# Patient Record
Sex: Male | Born: 1975 | Race: Black or African American | Hispanic: No | Marital: Married | Smoking: Never smoker
Health system: Southern US, Community
[De-identification: ages and names within clinical notes are randomized; demographics above are authoritative.]

## PROBLEM LIST (undated history)

## (undated) DIAGNOSIS — K219 Gastro-esophageal reflux disease without esophagitis: Secondary | ICD-10-CM

## (undated) DIAGNOSIS — G4730 Sleep apnea, unspecified: Secondary | ICD-10-CM

## (undated) DIAGNOSIS — R7303 Prediabetes: Secondary | ICD-10-CM

## (undated) DIAGNOSIS — E119 Type 2 diabetes mellitus without complications: Secondary | ICD-10-CM

## (undated) DIAGNOSIS — I10 Essential (primary) hypertension: Secondary | ICD-10-CM

## (undated) HISTORY — PX: VASECTOMY: SHX75

## (undated) HISTORY — DX: Essential (primary) hypertension: I10

## (undated) HISTORY — DX: Prediabetes: R73.03

## (undated) HISTORY — DX: Type 2 diabetes mellitus without complications: E11.9

---

## 2006-04-04 ENCOUNTER — Ambulatory Visit: Payer: Self-pay | Admitting: Sports Medicine

## 2006-11-02 ENCOUNTER — Ambulatory Visit: Payer: Self-pay | Admitting: Family Medicine

## 2006-11-02 LAB — CONVERTED CEMR LAB
ALT: 28 units/L (ref 0–53)
AST: 22 units/L (ref 0–37)
Alkaline Phosphatase: 57 units/L (ref 39–117)
BUN: 7 mg/dL (ref 6–23)
Basophils Relative: 0 % (ref 0.0–1.0)
Bilirubin, Direct: 0.1 mg/dL (ref 0.0–0.3)
CO2: 29 meq/L (ref 19–32)
Calcium: 9 mg/dL (ref 8.4–10.5)
Chloride: 104 meq/L (ref 96–112)
Eosinophils Relative: 1.2 % (ref 0.0–5.0)
GFR calc Af Amer: 125 mL/min
GFR calc non Af Amer: 103 mL/min
Glucose, Bld: 107 mg/dL — ABNORMAL HIGH (ref 70–99)
HDL: 26.9 mg/dL — ABNORMAL LOW (ref >39.0)
Ketones, urine, test strip: NEGATIVE
LDL Cholesterol: 117 mg/dL — ABNORMAL HIGH (ref 0–99)
Monocytes Relative: 12.8 % — ABNORMAL HIGH (ref 3.0–11.0)
Neutro Abs: 1.9 10*3/uL (ref 1.4–7.7)
Nitrite: NEGATIVE
Platelets: 216 10*3/uL (ref 150–400)
Protein, U semiquant: NEGATIVE
RBC: 4.66 M/uL (ref 4.22–5.81)
Total CHOL/HDL Ratio: 6.4
Total Protein: 7.3 g/dL (ref 6.0–8.3)
Triglycerides: 145 mg/dL (ref 0–149)
Urobilinogen, UA: 0.2
VLDL: 29 mg/dL (ref 0–40)
WBC Urine, dipstick: NEGATIVE
WBC: 4.9 10*3/uL (ref 4.5–10.5)

## 2006-11-09 ENCOUNTER — Ambulatory Visit: Payer: Self-pay | Admitting: Family Medicine

## 2006-11-09 DIAGNOSIS — K5900 Constipation, unspecified: Secondary | ICD-10-CM | POA: Insufficient documentation

## 2006-11-09 DIAGNOSIS — J45909 Unspecified asthma, uncomplicated: Secondary | ICD-10-CM | POA: Insufficient documentation

## 2006-11-09 DIAGNOSIS — J309 Allergic rhinitis, unspecified: Secondary | ICD-10-CM | POA: Insufficient documentation

## 2007-01-24 ENCOUNTER — Telehealth (INDEPENDENT_AMBULATORY_CARE_PROVIDER_SITE_OTHER): Payer: Self-pay | Admitting: *Deleted

## 2010-11-17 ENCOUNTER — Ambulatory Visit: Payer: Self-pay | Admitting: Family Medicine

## 2010-11-22 ENCOUNTER — Encounter: Payer: Self-pay | Admitting: Family Medicine

## 2010-11-22 ENCOUNTER — Ambulatory Visit (INDEPENDENT_AMBULATORY_CARE_PROVIDER_SITE_OTHER): Payer: 59 | Admitting: Family Medicine

## 2010-11-22 DIAGNOSIS — Z0000 Encounter for general adult medical examination without abnormal findings: Secondary | ICD-10-CM

## 2010-11-22 DIAGNOSIS — G4700 Insomnia, unspecified: Secondary | ICD-10-CM

## 2010-11-22 DIAGNOSIS — Z833 Family history of diabetes mellitus: Secondary | ICD-10-CM

## 2010-11-22 DIAGNOSIS — J309 Allergic rhinitis, unspecified: Secondary | ICD-10-CM

## 2010-11-22 LAB — CBC WITH DIFFERENTIAL/PLATELET
Basophils Relative: 0.5 % (ref 0.0–3.0)
Eosinophils Absolute: 0.1 10*3/uL (ref 0.0–0.7)
Eosinophils Relative: 1.3 % (ref 0.0–5.0)
HCT: 44.9 % (ref 39.0–52.0)
Lymphs Abs: 2 10*3/uL (ref 0.7–4.0)
MCHC: 33.4 g/dL (ref 30.0–36.0)
MCV: 95.1 fl (ref 78.0–100.0)
Monocytes Absolute: 0.6 10*3/uL (ref 0.1–1.0)
Neutro Abs: 2.2 10*3/uL (ref 1.4–7.7)
RBC: 4.72 Mil/uL (ref 4.22–5.81)
WBC: 4.9 10*3/uL (ref 4.5–10.5)

## 2010-11-22 LAB — HEPATIC FUNCTION PANEL
ALT: 33 U/L (ref 0–53)
Albumin: 4.2 g/dL (ref 3.5–5.2)
Total Protein: 7.5 g/dL (ref 6.0–8.3)

## 2010-11-22 LAB — BASIC METABOLIC PANEL
BUN: 7 mg/dL (ref 6–23)
Chloride: 105 mEq/L (ref 96–112)
Glucose, Bld: 93 mg/dL (ref 70–99)
Potassium: 3.8 mEq/L (ref 3.5–5.1)

## 2010-11-22 LAB — LIPID PANEL
Cholesterol: 153 mg/dL (ref 0–200)
HDL: 39.3 mg/dL (ref >39.00)
Total CHOL/HDL Ratio: 4
Triglycerides: 64 mg/dL (ref 0.0–149.0)

## 2010-11-22 LAB — POCT URINALYSIS DIPSTICK
Bilirubin, UA: NEGATIVE
Blood, UA: NEGATIVE
Glucose, UA: NEGATIVE
Ketones, UA: NEGATIVE
Nitrite, UA: NEGATIVE
Spec Grav, UA: 1.015

## 2010-11-22 MED ORDER — AMITRIPTYLINE HCL 10 MG PO TABS: 10.0000 mg | ORAL_TABLET | Freq: Every day | ORAL | Status: DC

## 2010-11-22 NOTE — Progress Notes (Signed)
Addended by: Bonnye Fava on: 11/22/2010 10:12 AM   Modules accepted: Orders

## 2010-11-22 NOTE — Progress Notes (Signed)
  Subjective:    Patient ID: Samuel Valencia, male    DOB: [birth year], 35 y.o.   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  He said no chronic health problems.  He does have a history of allergic rhinitis with sneezing, runny nose, dry mouth, and popping in years.  He also has a skin irritation.  The underside of his left eye.  That is chronic.  Review of systems negative except he wears glasses.  Tetanus booster 2011  Family history pertinent that both his mother and father have hypertension.  Dad has diabetes and hyperlipidemia.  One brother one sister both in good health.   Review of Systems    Review of systems negative except for above Objective:   Physical Exam  Constitutional: He is oriented to person, place, and time. He appears well-developed and well-nourished.  HENT:  Head: Normocephalic and atraumatic.  Right Ear: External ear normal.  Left Ear: External ear normal.  Nose: Nose normal.  Mouth/Throat: Oropharynx is clear and moist.  Eyes: Conjunctivae and EOM are normal. Pupils are equal, round, and reactive to light.  Neck: Normal range of motion. Neck supple. No JVD present. No tracheal deviation present. No thyromegaly present.  Cardiovascular: Normal rate, regular rhythm, normal heart sounds and intact distal pulses.  Exam reveals no gallop and no friction rub.   No murmur heard. Pulmonary/Chest: Effort normal and breath sounds normal. No stridor. No respiratory distress. He has no wheezes. He has no rales. He exhibits no tenderness.  Abdominal: Soft. Bowel sounds are normal. He exhibits no distension and no mass. There is no tenderness. There is no rebound and no guarding.  Genitourinary: Rectum normal, prostate normal and penis normal. Guaiac negative stool. No penile tenderness.  Musculoskeletal: Normal range of motion. He  exhibits no edema and no tenderness.  Lymphadenopathy:    He has no cervical adenopathy.  Neurological: He is alert and oriented to person, place, and time. He has normal reflexes. No cranial nerve deficit. He exhibits normal muscle tone.  Skin: Skin is warm and dry. No rash noted. No erythema. No pallor.  Psychiatric: He has a normal mood and affect. His behavior is normal. Judgment and thought content normal.          Assessment & Plan:  Healthy male.  Allergic rhinitis, plain Zyrtec or Claritin daily.  Check labs because of family history, hyperlipidemia, and diabetes

## 2010-11-22 NOTE — Patient Instructions (Signed)
Take 10 mg of Claritin ......... Plain........Marland Kitchen Daily  4 y  allergic rhinitis symptoms.  Indicate  Elavil  10 mg 30 minutes prior to bedtime.  Return in one year or sooner if any problems

## 2010-11-30 ENCOUNTER — Telehealth: Payer: Self-pay | Admitting: Family Medicine

## 2010-11-30 NOTE — Telephone Encounter (Signed)
Pt would like blood work results °

## 2010-12-01 NOTE — Telephone Encounter (Signed)
Tried to call but unable to reach or leave message for patient.  Result letter will be mailed.

## 2010-12-01 NOTE — Telephone Encounter (Signed)
Samuel Valencia please call labs all normal including blood sugar and hemoglobin A1C

## 2011-06-08 ENCOUNTER — Emergency Department (HOSPITAL_COMMUNITY): Payer: Worker's Compensation

## 2011-06-08 ENCOUNTER — Emergency Department (HOSPITAL_COMMUNITY)
Admission: EM | Admit: 2011-06-08 | Discharge: 2011-06-08 | Disposition: A | Discharge status: Home / Self Care | Payer: Worker's Compensation | Attending: Emergency Medicine | Admitting: Emergency Medicine

## 2011-06-08 ENCOUNTER — Encounter (HOSPITAL_COMMUNITY): Payer: Self-pay | Admitting: Emergency Medicine

## 2011-06-08 DIAGNOSIS — S6710XA Crushing injury of unspecified finger(s), initial encounter: Secondary | ICD-10-CM

## 2011-06-08 DIAGNOSIS — IMO0002 Reserved for concepts with insufficient information to code with codable children: Secondary | ICD-10-CM | POA: Insufficient documentation

## 2011-06-08 DIAGNOSIS — Y9289 Other specified places as the place of occurrence of the external cause: Secondary | ICD-10-CM | POA: Insufficient documentation

## 2011-06-08 MED ORDER — HYDROCODONE-ACETAMINOPHEN 5-325 MG PO TABS: 1.0000 | ORAL_TABLET | ORAL | Status: AC | PRN

## 2011-06-08 MED ORDER — IBUPROFEN 800 MG PO TABS
800.0000 mg | ORAL_TABLET | Freq: Once | ORAL | Status: AC
  Administered 2011-06-08: 800 mg via ORAL
  Filled 2011-06-08: qty 1

## 2011-06-08 NOTE — Discharge Instructions (Signed)
You were treated today for your crush injury to your finger. Your finger was placed in a splint to help rest the finger and help with healing. Continue to use rest, ice, compression and elevation to reduce swelling and pain. Please followup with your primary care provider for continued evaluation and treatment.  Crush Injury, Fingers or Toes A crush injury to the fingers or toes means the tissues have been damaged by being squeezed (compressed). There will be bleeding into the tissues and swelling. Often, blood will collect under the skin. When this happens, the skin on the finger often dies and may slough off (shed) 1 week to 10 days later. Usually, new skin is growing underneath. If the injury has been too severe and the tissue does not survive, the damaged tissue may begin to turn black over several days.  Wounds which occur because of the crushing may be stitched (sutured) shut. However, crush injuries are more likely to become infected than other injuries.These wounds may not be closed as tightly as other types of cuts to prevent infection. Nails involved are often lost. These usually grow back over several weeks.  DIAGNOSIS X-rays may be taken to see if there is any injury to the bones. TREATMENT Broken bones (fractures) may be treated with splinting, depending on the fracture. Often, no treatment is required for fractures of the last bone in the fingers or toes. HOME CARE INSTRUCTIONS   The crushed part should be raised (elevated) above the heart or center of the chest as much as possible for the first several days or as directed. This helps with pain and lessens swelling. Less swelling increases the chances that the crushed part will survive.   Put ice on the injured area.   Put ice in a plastic bag.   Place a towel between your skin and the bag.   Leave the ice on for 15 to 20 minutes, 3 to 4 times a day for the first 2 days.   Only take over-the-counter or prescription medicines for  pain, discomfort, or fever as directed by your caregiver.   Use your injured part only as directed.   Change your bandages (dressings) as directed.   Keep all follow-up appointments as directed by your caregiver. Not keeping your appointment could result in a chronic or permanent injury, pain, and disability. If there is any problem keeping the appointment, you must call to reschedule.  SEEK IMMEDIATE MEDICAL CARE IF:   There is redness, swelling, or increasing pain in the wound area.   Pus is coming from the wound.   You have a fever.   You notice a bad smell coming from the wound or dressing.   The edges of the wound do not stay together after the sutures have been removed.   You are unable to move the injured finger or toe.  MAKE SURE YOU:   Understand these instructions.   Will watch your condition.   Will get help right away if you are not doing well or get worse.  Document Released: 02/27/2005 Document Revised: 02/16/2011 Document Reviewed: 07/15/2010 [facility] Patient Information 2012 [place], [place].   RESOURCE GUIDE  Dental Problems  Patients with Medicaid: [facility] [phone] [address]  [address] Phone:  [phone]                                                  Phone:  [phone]  If unable to pay or uninsured, contact:  [facility] or [facility]. to become qualified for the adult dental clinic.  Chronic Pain Problems Contact [facility]  [phone] Patients need to be referred by their primary care doctor.  Insufficient Money for Medicine Contact United Way:  call "211" or [facility] 430-468-6094.  No Primary Care Doctor Call [facility]  [phone] Other agencies that provide inexpensive medical care    [facility]  [phone]    [facility]  [phone]    [facility]   [phone]    [facility]  [phone]    [facility]  [phone]    [facility]  [phone]  Psychological Services [facility]  [phone] [facility]  [phone] [facility]   [phone] (emergency services [phone])  Substance Abuse Resources [facility]  ([phone] [facility] [phone] [facility] [place] [phone] Floydene Flock [phone] Residential & Outpatient Substance Abuse Program  [phone]  Abuse/Neglect [facility] Child Abuse Hotline ([phone] [facility] Child Abuse Hotline 807-719-2187 (After Hours)  Emergency Shelter [facility] (531)164-9652  Maternity Homes Room at the [place] [facility] [place] ([phone] [facility] [phone]  MRSA Hotline #:   949-447-5986    [facility] Resources  [facility]     [employer]                          [facility] Dept. [address] [place]                       [address]      [address]  Blondell Reveal Phone:  [phone]                                   Phone:  [phone]                 Phone:  [phone]  [facility] Phone:  [phone]  [facility] Child Abuse Hotline 605-728-9797 [phone] (After Hours)

## 2011-06-08 NOTE — Progress Notes (Signed)
Orthopedic Tech Progress Note Patient Details:  ZAIR BORAWSKI Mar 04, 1976 [id number]  Type of Splint: Finger Splint Location: (R) UE midddle finger Splint Interventions: Application    Jennye Moccasin 06/08/2011, 9:54 PM

## 2011-06-08 NOTE — ED Provider Notes (Signed)
History     CSN: [id number]  Arrival date & time 06/08/11  2046   First MD Initiated Contact with Patient 06/08/11 2107      Chief Complaint  Patient presents with  . Finger Injury     HPI  History provided by the patient. Patient is a 36 year old male with history of hypertension who presents with complaints of right middle finger injury. Patient was at work when he reports having his finger slammed in a steel door. Patient complains of bruising and swelling to the finger. Pain is made worse with palpation some movements. Patient denies any associated numbness or weakness. Patient has not done anything to treat his symptoms.    History reviewed. No pertinent past medical history.  No past surgical history on file.  Family History  Problem Relation Age of Onset  . Hypertension Mother   . Hypertension Father   . Hyperlipidemia Father   . Diabetes Father     History  Substance Use Topics  . Smoking status: Never Smoker   . Smokeless tobacco: Not on file  . Alcohol Use: No      Review of Systems  Gastrointestinal: Negative for nausea.  Skin: Negative for rash and wound.  Neurological: Negative for weakness and numbness.    Allergies  Review of patient's allergies indicates no known allergies.  Home Medications   Current Outpatient Rx  Name Route Sig Dispense Refill  . AMITRIPTYLINE HCL 10 MG PO TABS Oral Take 1 tablet (10 mg total) by mouth at bedtime. 100 tablet 3    BP 153/101  Pulse 80  Temp(Src) 98.5 F (36.9 C) (Oral)  Resp 20  SpO2 94%  Physical Exam  Nursing note and vitals reviewed. Constitutional: He is oriented to person, place, and time. He appears well-developed and well-nourished. No distress.  HENT:  Head: Normocephalic.  Cardiovascular: Normal rate and regular rhythm.   Pulmonary/Chest: Effort normal and breath sounds normal.  Musculoskeletal:        Partial sublingual hematoma of right middle finger. Mild swelling to the distal tip  of finger. No injuries to the skin. Normal lateral medial sensations of finger. Normal cap refill.  Neurological: He is alert and oriented to person, place, and time.  Psychiatric: He has a normal mood and affect. His behavior is normal.    ED Course  Procedures    Dg Finger Middle Right  06/08/2011  *RADIOLOGY REPORT*  Clinical Data: Jammed finger, pain.  RIGHT MIDDLE FINGER 2+V  Comparison:  None.  Findings:  There is no evidence of fracture or dislocation.  There is no evidence of arthropathy or other focal bone abnormality. Soft tissues are unremarkable.  IMPRESSION: Negative.  Original Report Authenticated By: Elsie Stain, M.D.     1. Crush injury to finger       MDM  9:15 PM patient seen and evaluated. Patient in no acute distress.         Angus Seller, [place] 06/08/11 2216

## 2011-06-08 NOTE — ED Notes (Signed)
PT. INJURED RIGHT MIDDLE FINGER WHILE AT WORK THIS EVENING , STATES HIT AGAINST METAL DOOR , NO BLEEDING . PAIN WITH SWELLING

## 2011-06-09 NOTE — ED Provider Notes (Signed)
Medical screening examination/treatment/procedure(s) were performed by non-physician practitioner and as supervising physician I was immediately available for consultation/collaboration.   Carleene Cooper III, MD 06/09/11 [phone]

## 2012-02-20 ENCOUNTER — Other Ambulatory Visit: Payer: Self-pay | Admitting: Family Medicine

## 2012-02-28 ENCOUNTER — Other Ambulatory Visit: Payer: Self-pay | Admitting: Family Medicine

## 2012-07-24 ENCOUNTER — Ambulatory Visit (INDEPENDENT_AMBULATORY_CARE_PROVIDER_SITE_OTHER)
Admission: RE | Admit: 2012-07-24 | Discharge: 2012-07-24 | Disposition: A | Discharge status: Home / Self Care | Payer: 59 | Source: Ambulatory Visit | Attending: Family Medicine | Admitting: Family Medicine

## 2012-07-24 ENCOUNTER — Telehealth: Payer: Self-pay | Admitting: Family Medicine

## 2012-07-24 DIAGNOSIS — Z0000 Encounter for general adult medical examination without abnormal findings: Secondary | ICD-10-CM

## 2012-07-24 NOTE — Telephone Encounter (Signed)
Patient's wife called stating that he need an order for a chest xray as he had a positive tb skin test. Please advise/assist.

## 2012-07-24 NOTE — Telephone Encounter (Signed)
Patient aware.

## 2012-07-24 NOTE — Telephone Encounter (Signed)
Okay per Dr Tawanna Cooler.  Orders Placed This Encounter  Procedures  . DG Chest 2 View    Standing Status: Future     Number of Occurrences:      Standing Expiration Date: 09/23/2013    Order Specific Question:  Reason for Exam (SYMPTOM  OR DIAGNOSIS REQUIRED)    Answer:  positive Tb test    Order Specific Question:  Preferred imaging location?    Answer:  [facility]   Please inform patient

## 2012-07-25 ENCOUNTER — Telehealth: Payer: Self-pay | Admitting: Family Medicine

## 2012-07-25 NOTE — Telephone Encounter (Signed)
Pt 's wife calling for results of pt's xray done yesterday. Pls call.

## 2012-07-25 NOTE — Telephone Encounter (Signed)
Spoke with wife

## 2012-08-30 ENCOUNTER — Other Ambulatory Visit: Payer: Self-pay

## 2012-09-05 ENCOUNTER — Encounter: Payer: Self-pay | Admitting: Family Medicine

## 2012-09-06 ENCOUNTER — Other Ambulatory Visit: Payer: Self-pay

## 2012-09-18 ENCOUNTER — Encounter: Payer: Self-pay | Admitting: Family Medicine

## 2013-03-27 ENCOUNTER — Other Ambulatory Visit (INDEPENDENT_AMBULATORY_CARE_PROVIDER_SITE_OTHER): Payer: PRIVATE HEALTH INSURANCE

## 2013-03-27 DIAGNOSIS — Z0000 Encounter for general adult medical examination without abnormal findings: Secondary | ICD-10-CM

## 2013-03-27 LAB — CBC WITH DIFFERENTIAL/PLATELET
BASOS PCT: 0.6 % (ref 0.0–3.0)
Basophils Absolute: 0 10*3/uL (ref 0.0–0.1)
Eosinophils Absolute: 0.7 10*3/uL (ref 0.0–0.7)
Eosinophils Relative: 9.8 % — ABNORMAL HIGH (ref 0.0–5.0)
HCT: 42.6 % (ref 39.0–52.0)
HEMOGLOBIN: 14.5 g/dL (ref 13.0–17.0)
LYMPHS PCT: 35.3 % (ref 12.0–46.0)
Lymphs Abs: 2.6 10*3/uL (ref 0.7–4.0)
MCHC: 34 g/dL (ref 30.0–36.0)
MCV: 93.7 fl (ref 78.0–100.0)
MONOS PCT: 10.7 % (ref 3.0–12.0)
Monocytes Absolute: 0.8 10*3/uL (ref 0.1–1.0)
NEUTROS ABS: 3.2 10*3/uL (ref 1.4–7.7)
Neutrophils Relative %: 43.6 % (ref 43.0–77.0)
Platelets: 193 10*3/uL (ref 150.0–400.0)
RBC: 4.55 Mil/uL (ref 4.22–5.81)
RDW: 13.3 % (ref 11.5–14.6)
WBC: 7.4 10*3/uL (ref 4.5–10.5)

## 2013-03-27 LAB — HEPATIC FUNCTION PANEL
ALBUMIN: 4.1 g/dL (ref 3.5–5.2)
ALK PHOS: 60 U/L (ref 39–117)
ALT: 35 U/L (ref 0–53)
AST: 28 U/L (ref 0–37)
Bilirubin, Direct: 0.1 mg/dL (ref 0.0–0.3)
TOTAL PROTEIN: 7.6 g/dL (ref 6.0–8.3)
Total Bilirubin: 0.9 mg/dL (ref 0.3–1.2)

## 2013-03-27 LAB — LIPID PANEL
CHOL/HDL RATIO: 5
Cholesterol: 157 mg/dL (ref 0–200)
HDL: 32.7 mg/dL — AB (ref >39.00)
LDL Cholesterol: 95 mg/dL (ref 0–99)
Triglycerides: 146 mg/dL (ref 0.0–149.0)
VLDL: 29.2 mg/dL (ref 0.0–40.0)

## 2013-03-27 LAB — BASIC METABOLIC PANEL
BUN: 9 mg/dL (ref 6–23)
CALCIUM: 8.7 mg/dL (ref 8.4–10.5)
CO2: 29 mEq/L (ref 19–32)
Chloride: 103 mEq/L (ref 96–112)
Creatinine, Ser: 0.9 mg/dL (ref 0.4–1.5)
GFR: 126.77 mL/min (ref >60.00)
Glucose, Bld: 102 mg/dL — ABNORMAL HIGH (ref 70–99)
Potassium: 3.2 mEq/L — ABNORMAL LOW (ref 3.5–5.1)
SODIUM: 140 meq/L (ref 135–145)

## 2013-03-27 LAB — POCT URINALYSIS DIPSTICK
Bilirubin, UA: NEGATIVE
Glucose, UA: NEGATIVE
KETONES UA: NEGATIVE
LEUKOCYTES UA: NEGATIVE
NITRITE UA: NEGATIVE
PH UA: 7
PROTEIN UA: NEGATIVE
RBC UA: NEGATIVE
Spec Grav, UA: 1.015
Urobilinogen, UA: 0.2

## 2013-03-27 LAB — TSH: TSH: 1.77 u[IU]/mL (ref 0.35–5.50)

## 2013-04-03 ENCOUNTER — Encounter: Payer: Self-pay | Admitting: Family Medicine

## 2013-04-08 ENCOUNTER — Encounter: Payer: Self-pay | Admitting: Family Medicine

## 2013-04-08 ENCOUNTER — Ambulatory Visit (INDEPENDENT_AMBULATORY_CARE_PROVIDER_SITE_OTHER): Payer: PRIVATE HEALTH INSURANCE | Admitting: Family Medicine

## 2013-04-08 VITALS — BP 130/90 | Temp 97.8°F | Ht 71.5 in | Wt 248.0 lb

## 2013-04-08 DIAGNOSIS — S63200A Unspecified subluxation of right index finger, initial encounter: Secondary | ICD-10-CM

## 2013-04-08 DIAGNOSIS — Z0000 Encounter for general adult medical examination without abnormal findings: Secondary | ICD-10-CM

## 2013-04-08 DIAGNOSIS — J309 Allergic rhinitis, unspecified: Secondary | ICD-10-CM

## 2013-04-08 DIAGNOSIS — R002 Palpitations: Secondary | ICD-10-CM

## 2013-04-08 DIAGNOSIS — S63259A Unspecified dislocation of unspecified finger, initial encounter: Secondary | ICD-10-CM

## 2013-04-08 NOTE — Progress Notes (Signed)
   Subjective:    Patient ID: Samuel Valencia, male    DOB: [birth year], 38 y.o.   MRN: [id number]  HPI Mohamad is a 38 year old married male nonsmoker who comes in today for general physical examination  He says over the last couple months he felt like his had some episodes of rapid heart rate. His caffeine consumption is minimal.  Is also some numbness in his tongue for couple weeks etiology unknown  Also couple months ago he was playing basketball and dislocated his right index finger. He popped it back and joint. Is working okay but in that knuckle still swollen and sore  Negative family history for prostate and colon cancer   Review of Systems  Constitutional: Negative.   HENT: Negative.   Eyes: Negative.   Respiratory: Negative.   Cardiovascular: Negative.   Gastrointestinal: Negative.   Endocrine: Negative.   Genitourinary: Negative.   Musculoskeletal: Negative.   Skin: Negative.   Allergic/Immunologic: Negative.   Neurological: Negative.   Hematological: Negative.   Psychiatric/Behavioral: Negative.        Objective:   Physical Exam  Nursing note and vitals reviewed. Constitutional: He is oriented to person, place, and time. He appears well-developed and well-nourished.  HENT:  Head: Normocephalic and atraumatic.  Right Ear: External ear normal.  Left Ear: External ear normal.  Nose: Nose normal.  Mouth/Throat: Oropharynx is clear and moist.  Eyes: Conjunctivae and EOM are normal. Pupils are equal, round, and reactive to light.  Neck: Normal range of motion. Neck supple. No JVD present. No tracheal deviation present. No thyromegaly present.  Cardiovascular: Normal rate, regular rhythm, normal heart sounds and intact distal pulses.  Exam reveals no gallop and no friction rub.   No murmur heard. No carotid or aortic bruits peripheral pulses 2+ and symmetrical  Pulmonary/Chest: Effort normal and breath sounds normal. No stridor. No respiratory distress. He has no  wheezes. He has no rales. He exhibits no tenderness.  Abdominal: Soft. Bowel sounds are normal. He exhibits no distension and no mass. There is no tenderness. There is no rebound and no guarding.  Genitourinary: Rectum normal, prostate normal and penis normal. Guaiac negative stool. No penile tenderness.  Musculoskeletal: Normal range of motion. He exhibits no edema and no tenderness.  No joint right index finger swollen but he has fairly normal range of motion  Lymphadenopathy:    He has no cervical adenopathy.  Neurological: He is alert and oriented to person, place, and time. He has normal reflexes. No cranial nerve deficit. He exhibits normal muscle tone.  Skin: Skin is warm and dry. No rash noted. No erythema. No pallor.  Psychiatric: He has a normal mood and affect. His behavior is normal. Judgment and thought content normal.   He's also had a vasectomy       Assessment & Plan:  Healthy male  Question palpitations versus muscle fasciculations in the chest wall............ taught him how to check his pulse. If he has recurrent symptoms and rapid heart rate he's to come back and see [place] for followup  Dislocation middle joint right index finger healing spontaneously

## 2013-04-08 NOTE — Progress Notes (Signed)
Pre visit review using our clinic review tool, if applicable. No additional management support is needed unless otherwise documented below in the visit note. 

## 2013-04-08 NOTE — Patient Instructions (Signed)
If you have that sensation in your chest check your pulse as we discussed. If you pulse is normal and regular then the sensation is some muscle spasm in the chest wall. However if it heart rate is rapid call and come in and let us review the situation  Return in one year at for general medical exam

## 2013-07-03 ENCOUNTER — Encounter: Payer: Self-pay | Admitting: Family Medicine

## 2013-07-03 ENCOUNTER — Ambulatory Visit (INDEPENDENT_AMBULATORY_CARE_PROVIDER_SITE_OTHER): Payer: PRIVATE HEALTH INSURANCE | Admitting: Family Medicine

## 2013-07-03 VITALS — BP 120/90 | Temp 97.7°F | Wt 258.0 lb

## 2013-07-03 DIAGNOSIS — H0019 Chalazion unspecified eye, unspecified eyelid: Secondary | ICD-10-CM

## 2013-07-03 DIAGNOSIS — H0011 Chalazion right upper eyelid: Secondary | ICD-10-CM | POA: Insufficient documentation

## 2013-07-03 MED ORDER — CEPHALEXIN 500 MG PO CAPS: ORAL_CAPSULE | ORAL | Status: DC

## 2013-07-03 NOTE — Patient Instructions (Signed)
Warm soaks 10-15 minutes 4 times daily  Keflex 500 mg,,,,,,,,,, 2 tabs twice daily  Dr. Calvert Cantor,,

## 2013-07-03 NOTE — Progress Notes (Signed)
   Subjective:    Patient ID: Samuel Valencia, male    DOB: Jul 03, 1975, 38 y.o.   MRN: [id number]  HPI  Samuel Valencia is a 38 year old male who comes in today for evaluation of swelling in his right eyelid for one week  He noticed a bump in his right upper eyelid a week ago. It's not gotten in better nor has it gotten any worse. He has no fever chills change in vision etc. He states he had a problem like this in the past he squeezed it and a little cyst popped out  Review of Systems Negative    Objective:   Physical Exam Well-developed well-nourished male no acute distress vital signs stable he is afebrile there is a 3 mm type chalazion right upper eyelid. Dilute itself is not erythematous or swollen. Conjunctiva normal vision normal       Assessment & Plan:  Chalazion right upper eyelid warm soaks antibiotics I consult when necessary

## 2013-07-03 NOTE — Progress Notes (Signed)
Pre visit review using our clinic review tool, if applicable. No additional management support is needed unless otherwise documented below in the visit note. 

## 2013-11-06 ENCOUNTER — Encounter: Payer: Self-pay | Admitting: Family Medicine

## 2013-11-06 ENCOUNTER — Ambulatory Visit (INDEPENDENT_AMBULATORY_CARE_PROVIDER_SITE_OTHER): Payer: PRIVATE HEALTH INSURANCE | Admitting: Family Medicine

## 2013-11-06 VITALS — BP 140/98 | Temp 98.0°F | Wt 253.0 lb

## 2013-11-06 DIAGNOSIS — H0019 Chalazion unspecified eye, unspecified eyelid: Secondary | ICD-10-CM

## 2013-11-06 DIAGNOSIS — Z23 Encounter for immunization: Secondary | ICD-10-CM

## 2013-11-06 DIAGNOSIS — H0011 Chalazion right upper eyelid: Secondary | ICD-10-CM

## 2013-11-06 DIAGNOSIS — I10 Essential (primary) hypertension: Secondary | ICD-10-CM | POA: Insufficient documentation

## 2013-11-06 MED ORDER — LISINOPRIL-HYDROCHLOROTHIAZIDE 10-12.5 MG PO TABS: 1.0000 | ORAL_TABLET | Freq: Every day | ORAL | Status: DC

## 2013-11-06 MED ORDER — CEPHALEXIN 500 MG PO CAPS: ORAL_CAPSULE | ORAL | Status: DC

## 2013-11-06 NOTE — Progress Notes (Signed)
Pre visit review using our clinic review tool, if applicable. No additional management support is needed unless otherwise documented below in the visit note. 

## 2013-11-06 NOTE — Progress Notes (Signed)
   Subjective:    Patient ID: Samuel Valencia, male    DOB: 1975/07/09, 38 y.o.   MRN: [id number]  HPI Samuel Valencia is a 38 year old married male nonsmoker who comes in today for evaluation of new-onset hypertension  3 weeks ago he had an insurance exam and his blood pressure was elevated. They came back a week later and is still elevated.  Both his mother and father have hypertension  BP today 140/98  He also has a chill lazy in his right lower eyelid. He had one before and we treated with Keflex 1 g twice a day at resolved and not recurred.   Review of Systems    review of systems otherwise negative Objective:   Physical Exam  Well-developed well-nourished male no acute distress vital signs stable is afebrile BP right arm sitting position 140/98 pulse 70 and regular  Left eye normal right eye shows a collection lazy and lower lid      Assessment & Plan:  New-onset hypertension........Marland Kitchen begin [employer]...........Marland Kitchen Keflex.......... I. consult when necessary.

## 2013-11-06 NOTE — Patient Instructions (Signed)
Zestoretic.............. one half tab daily in the morning  Omron digital pump up blood pressure cuff...........Marland Kitchen [employer]  Check your blood pressure daily in the morning  Return in 2 weeks for followup  Keflex 500 mg.......... 2 tabs twice daily,,,,,,,,, consult with Dr. Darleen Crocker,,,,,,,,, ophthalmologist when necessary

## 2013-11-20 ENCOUNTER — Ambulatory Visit: Payer: Self-pay | Admitting: Family Medicine

## 2013-11-24 ENCOUNTER — Encounter: Payer: Self-pay | Admitting: Family Medicine

## 2013-11-24 ENCOUNTER — Ambulatory Visit (INDEPENDENT_AMBULATORY_CARE_PROVIDER_SITE_OTHER): Payer: PRIVATE HEALTH INSURANCE | Admitting: Family Medicine

## 2013-11-24 VITALS — BP 128/84 | Temp 98.2°F | Wt 240.1 lb

## 2013-11-24 DIAGNOSIS — I10 Essential (primary) hypertension: Secondary | ICD-10-CM

## 2013-11-24 LAB — BASIC METABOLIC PANEL
BUN: 12 mg/dL (ref 6–23)
CHLORIDE: 102 meq/L (ref 96–112)
CO2: 28 mEq/L (ref 19–32)
Calcium: 9.2 mg/dL (ref 8.4–10.5)
Creatinine, Ser: 1.1 mg/dL (ref 0.4–1.5)
GFR: 97.14 mL/min (ref >60.00)
Glucose, Bld: 92 mg/dL (ref 70–99)
Potassium: 2.9 mEq/L — ABNORMAL LOW (ref 3.5–5.1)
Sodium: 140 mEq/L (ref 135–145)

## 2013-11-24 NOTE — Progress Notes (Signed)
Pre visit review using our clinic review tool, if applicable. No additional management support is needed unless otherwise documented below in the visit note. 

## 2013-11-24 NOTE — Progress Notes (Signed)
   Subjective:    Patient ID: Delray Alt, male    DOB: 10/12/1975, 38 y.o.   MRN: [id number]  HPI TRUE  is a 38 year old male who comes in today for followup of new-onset hypertension  We saw him a couple weeks with new-onset hypertension and started on Zestoretic 10-12.5. He's done well BP today 128/84  He's also been on a diet and exercise program and has lost 12 pounds   Review of Systems Review of systems negative no side effects to medication no cough no hives    Objective:   Physical Exam  Well-developed well nourished male no acute distress vital signs stable he is afebrile BP right arm sitting position 120/84      Assessment & Plan:  New-onset hypertension....... at goal on lisinopril 10-12.5 daily...Marland KitchenMarland KitchenMarland Kitchen continue current medication BP check weekly followup in 3 months check renal function today

## 2013-11-24 NOTE — Patient Instructions (Signed)
Continue your medication one pill daily in the morning  Check your blood pressure weekly  If you get an abnormal reading........Marland Kitchen goal 135/85 or less......... then checked daily for 2 weeks and look at the data........ if your blood pressure drop back to normal then continue your medicine and go back and check your blood pressure once weekly......... if however you get 2 weeks of persistent blood pressure elevations during the device and the data and come back and see me for reevaluation  Followup in 3 months sooner if any problems  Labs today to monitor renal function

## 2013-11-25 ENCOUNTER — Telehealth: Payer: Self-pay | Admitting: Family Medicine

## 2013-11-25 ENCOUNTER — Other Ambulatory Visit: Payer: Self-pay | Admitting: Family Medicine

## 2013-11-25 DIAGNOSIS — E876 Hypokalemia: Secondary | ICD-10-CM

## 2013-11-25 MED ORDER — LISINOPRIL 10 MG PO TABS: 10.0000 mg | ORAL_TABLET | Freq: Every day | ORAL | Status: DC

## 2013-11-25 NOTE — Telephone Encounter (Signed)
Spoke with pharmacist.  New Rx correct due to hydro kalemia

## 2013-11-25 NOTE — Telephone Encounter (Signed)
[facility] called to say that pt has been on Lisinopril/HCTZ 10/12.5mg . Only plain lisinopril 10mg  was was sent in this time. Needs new Rx if he is to continue combo medication.

## 2013-12-15 ENCOUNTER — Other Ambulatory Visit: Payer: Self-pay | Admitting: Family Medicine

## 2013-12-30 ENCOUNTER — Other Ambulatory Visit (INDEPENDENT_AMBULATORY_CARE_PROVIDER_SITE_OTHER): Payer: PRIVATE HEALTH INSURANCE

## 2013-12-30 DIAGNOSIS — E876 Hypokalemia: Secondary | ICD-10-CM

## 2013-12-30 DIAGNOSIS — I10 Essential (primary) hypertension: Secondary | ICD-10-CM

## 2013-12-30 LAB — BASIC METABOLIC PANEL
BUN: 8 mg/dL (ref 6–23)
CO2: 25 mEq/L (ref 19–32)
Calcium: 9.1 mg/dL (ref 8.4–10.5)
Chloride: 105 mEq/L (ref 96–112)
Creatinine, Ser: 0.9 mg/dL (ref 0.4–1.5)
GFR: 121.39 mL/min (ref >60.00)
GLUCOSE: 102 mg/dL — AB (ref 70–99)
Potassium: 3.6 mEq/L (ref 3.5–5.1)
SODIUM: 141 meq/L (ref 135–145)

## 2014-02-23 ENCOUNTER — Ambulatory Visit: Payer: PRIVATE HEALTH INSURANCE | Admitting: Family Medicine

## 2014-03-02 ENCOUNTER — Encounter: Payer: Self-pay | Admitting: Family Medicine

## 2014-03-02 ENCOUNTER — Ambulatory Visit (INDEPENDENT_AMBULATORY_CARE_PROVIDER_SITE_OTHER): Payer: PRIVATE HEALTH INSURANCE | Admitting: Family Medicine

## 2014-03-02 VITALS — BP 130/98 | Temp 98.0°F | Wt 243.0 lb

## 2014-03-02 DIAGNOSIS — I10 Essential (primary) hypertension: Secondary | ICD-10-CM

## 2014-03-02 MED ORDER — LISINOPRIL 20 MG PO TABS: 20.0000 mg | ORAL_TABLET | Freq: Every day | ORAL | Status: DC

## 2014-03-02 NOTE — Progress Notes (Signed)
Pre visit review using our clinic review tool, if applicable. No additional management support is needed unless otherwise documented below in the visit note. 

## 2014-03-02 NOTE — Patient Instructions (Signed)
Lisinopril 20 mg daily  BP check every morning  Follow-up mid January 2016 for your annual exam  Labs fasting one week prior  When you return bring a record of all your blood pressure readings and the device

## 2014-03-02 NOTE — Progress Notes (Signed)
   Subjective:    Patient ID: Samuel Valencia, male    DOB: 04/05/1975, 38 y.o.   MRN: [id number]  HPI Samuel Valencia is a 38 year old male married nonsmoker who comes in today for evaluation of elevated blood sugar and hypertension  His BP on lisinopril 10 mg daily is 130/98. He's getting the same readings at home  Blood sugar fasting 102. Weight 243 pounds   Review of Systems    review of systems otherwise negative Objective:   Physical Exam Well-developed well-nourished male no acute distress vital signs stable is afebrile except for BP right arm sitting position 130/98       Assessment & Plan:  Hypertension not at goal......... increase lisinopril to 20 mg daily....... BP check every morning........ follow-up mid-January

## 2014-03-04 ENCOUNTER — Telehealth: Payer: Self-pay | Admitting: Family Medicine

## 2014-03-04 NOTE — Telephone Encounter (Signed)
emmi mailed  °

## 2014-04-02 ENCOUNTER — Other Ambulatory Visit (INDEPENDENT_AMBULATORY_CARE_PROVIDER_SITE_OTHER): Payer: PRIVATE HEALTH INSURANCE

## 2014-04-02 DIAGNOSIS — I10 Essential (primary) hypertension: Secondary | ICD-10-CM

## 2014-04-02 LAB — POCT URINALYSIS DIPSTICK
Glucose, UA: NEGATIVE
Leukocytes, UA: NEGATIVE
NITRITE UA: NEGATIVE
RBC UA: NEGATIVE
UROBILINOGEN UA: 1
pH, UA: 5.5

## 2014-04-02 LAB — CBC WITH DIFFERENTIAL/PLATELET
Basophils Absolute: 0 10*3/uL (ref 0.0–0.1)
Basophils Relative: 0.4 % (ref 0.0–3.0)
EOS ABS: 0.1 10*3/uL (ref 0.0–0.7)
Eosinophils Relative: 2 % (ref 0.0–5.0)
HCT: 43.3 % (ref 39.0–52.0)
HEMOGLOBIN: 14.8 g/dL (ref 13.0–17.0)
LYMPHS PCT: 37.6 % (ref 12.0–46.0)
Lymphs Abs: 2.5 10*3/uL (ref 0.7–4.0)
MCHC: 34.3 g/dL (ref 30.0–36.0)
MCV: 92.4 fl (ref 78.0–100.0)
Monocytes Absolute: 0.7 10*3/uL (ref 0.1–1.0)
Monocytes Relative: 10.7 % (ref 3.0–12.0)
NEUTROS ABS: 3.3 10*3/uL (ref 1.4–7.7)
Neutrophils Relative %: 49.3 % (ref 43.0–77.0)
Platelets: 220 10*3/uL (ref 150.0–400.0)
RBC: 4.68 Mil/uL (ref 4.22–5.81)
RDW: 13.1 % (ref 11.5–15.5)
WBC: 6.7 10*3/uL (ref 4.0–10.5)

## 2014-04-02 LAB — BASIC METABOLIC PANEL
BUN: 10 mg/dL (ref 6–23)
CO2: 28 meq/L (ref 19–32)
Calcium: 9.5 mg/dL (ref 8.4–10.5)
Chloride: 106 mEq/L (ref 96–112)
Creatinine, Ser: 0.95 mg/dL (ref 0.40–1.50)
GFR: 112.44 mL/min (ref >60.00)
GLUCOSE: 108 mg/dL — AB (ref 70–99)
Potassium: 3.7 mEq/L (ref 3.5–5.1)
SODIUM: 141 meq/L (ref 135–145)

## 2014-04-02 LAB — HEPATIC FUNCTION PANEL
ALT: 24 U/L (ref 0–53)
AST: 19 U/L (ref 0–37)
Albumin: 4.3 g/dL (ref 3.5–5.2)
Alkaline Phosphatase: 56 U/L (ref 39–117)
BILIRUBIN DIRECT: 0.1 mg/dL (ref 0.0–0.3)
TOTAL PROTEIN: 7.2 g/dL (ref 6.0–8.3)
Total Bilirubin: 0.8 mg/dL (ref 0.2–1.2)

## 2014-04-02 LAB — TSH: TSH: 1.06 u[IU]/mL (ref 0.35–4.50)

## 2014-04-02 LAB — LIPID PANEL
CHOLESTEROL: 162 mg/dL (ref 0–200)
HDL: 32.3 mg/dL — AB (ref >39.00)
LDL Cholesterol: 97 mg/dL (ref 0–99)
NonHDL: 129.7
Total CHOL/HDL Ratio: 5
Triglycerides: 164 mg/dL — ABNORMAL HIGH (ref 0.0–149.0)
VLDL: 32.8 mg/dL (ref 0.0–40.0)

## 2014-04-02 LAB — PSA: PSA: 0.84 ng/mL (ref 0.10–4.00)

## 2014-04-02 LAB — HEMOGLOBIN A1C: Hgb A1c MFr Bld: 6 % (ref 4.6–6.5)

## 2014-04-09 ENCOUNTER — Ambulatory Visit (INDEPENDENT_AMBULATORY_CARE_PROVIDER_SITE_OTHER): Payer: PRIVATE HEALTH INSURANCE | Admitting: Family Medicine

## 2014-04-09 ENCOUNTER — Encounter: Payer: Self-pay | Admitting: Family Medicine

## 2014-04-09 VITALS — BP 150/98 | Temp 98.0°F | Ht 71.5 in | Wt 244.0 lb

## 2014-04-09 DIAGNOSIS — I10 Essential (primary) hypertension: Secondary | ICD-10-CM

## 2014-04-09 MED ORDER — LISINOPRIL 40 MG PO TABS: 40.0000 mg | ORAL_TABLET | Freq: Every day | ORAL | Status: DC

## 2014-04-09 NOTE — Progress Notes (Signed)
   Subjective:    Patient ID: Samuel Valencia, male    DOB: Dec 18, 1975, 39 y.o.   MRN: [id number]  HPI Samuel Valencia is a 39 year old male married nonsmoker who comes in today for annual physical examination because of history of hypertension. He's on lisinopril 20 mg daily BP is not normal. Today he rates 150/98. He says at home it's from 140 to high 80s.  He gets routine eye care, dental care, colonoscopy not until age 40 because no family history of colon cancer polyps. Vaccinations up-to-date  He walks all day long. He works for security at [employer]. Married his wife makes candles   Review of Systems  Constitutional: Negative.   HENT: Negative.   Eyes: Negative.   Respiratory: Negative.   Cardiovascular: Negative.   Gastrointestinal: Negative.   Endocrine: Negative.   Genitourinary: Negative.   Musculoskeletal: Negative.   Skin: Negative.   Allergic/Immunologic: Negative.   Neurological: Negative.   Hematological: Negative.   Psychiatric/Behavioral: Negative.        Objective:   Physical Exam  Constitutional: He is oriented to person, place, and time. He appears well-developed and well-nourished.  HENT:  Head: Normocephalic and atraumatic.  Right Ear: External ear normal.  Left Ear: External ear normal.  Nose: Nose normal.  Mouth/Throat: Oropharynx is clear and moist.  Eyes: Conjunctivae and EOM are normal. Pupils are equal, round, and reactive to light.  Neck: Normal range of motion. Neck supple. No JVD present. No tracheal deviation present. No thyromegaly present.  Cardiovascular: Normal rate, regular rhythm, normal heart sounds and intact distal pulses.  Exam reveals no gallop and no friction rub.   No murmur heard. Pulmonary/Chest: Effort normal and breath sounds normal. No stridor. No respiratory distress. He has no wheezes. He has no rales. He exhibits no tenderness.  Abdominal: Soft. Bowel sounds are normal. He exhibits no distension and no mass. There is no  tenderness. There is no rebound and no guarding.  Genitourinary: Rectum normal and penis normal. Guaiac negative stool. No penile tenderness.  1+ and symmetrical nonnodular BPH  Musculoskeletal: Normal range of motion. He exhibits no edema or tenderness.  Lymphadenopathy:    He has no cervical adenopathy.  Neurological: He is alert and oriented to person, place, and time. He has normal reflexes. No cranial nerve deficit. He exhibits normal muscle tone.  Skin: Skin is warm and dry. No rash noted. No erythema. No pallor.  Psychiatric: He has a normal mood and affect. His behavior is normal. Judgment and thought content normal.  Nursing note and vitals reviewed.         Assessment & Plan:  Healthy male  Hypertension not at goal increase lisinopril to 40 mg daily BP check every morning follow-up in one month  Mild BPH normal PSA  2+ proteinuria,,,,,,,,,,,, normal renal function...Marland KitchenMarland Kitchen Previous urinalysis were normal,,,,,,,,,, patient admits to taking a protein supplement at the gym,,,,,,,,, advised him to stop that and return in a month for follow-up urinalysis

## 2014-04-09 NOTE — Progress Notes (Signed)
Pre visit review using our clinic review tool, if applicable. No additional management support is needed unless otherwise documented below in the visit note. 

## 2014-04-09 NOTE — Patient Instructions (Addendum)
Increase the lisinopril dose to 40 mg daily......... check your BP once a day in the morning.......... if after 4 weeks your blood pressure is normal........Marland Kitchen 135/85 or less continue the dose of 40 mg daily and check your blood pressure weekly  Return in one year for general physical examination sooner if any problems  Rachel's extension is [phone]  Stop the supplements  Return in one month for follow-up and bring in your first morning urine specimen

## 2014-05-11 ENCOUNTER — Encounter: Payer: Self-pay | Admitting: Family Medicine

## 2014-05-11 ENCOUNTER — Ambulatory Visit (INDEPENDENT_AMBULATORY_CARE_PROVIDER_SITE_OTHER): Payer: PRIVATE HEALTH INSURANCE | Admitting: Family Medicine

## 2014-05-11 VITALS — BP 140/98 | Temp 98.0°F | Wt 247.0 lb

## 2014-05-11 DIAGNOSIS — E782 Mixed hyperlipidemia: Secondary | ICD-10-CM

## 2014-05-11 DIAGNOSIS — R739 Hyperglycemia, unspecified: Secondary | ICD-10-CM

## 2014-05-11 DIAGNOSIS — R8290 Unspecified abnormal findings in urine: Secondary | ICD-10-CM

## 2014-05-11 DIAGNOSIS — I10 Essential (primary) hypertension: Secondary | ICD-10-CM

## 2014-05-11 DIAGNOSIS — R7309 Other abnormal glucose: Secondary | ICD-10-CM

## 2014-05-11 LAB — POCT URINALYSIS DIPSTICK
Bilirubin, UA: NEGATIVE
Blood, UA: NEGATIVE
Glucose, UA: NEGATIVE
KETONES UA: NEGATIVE
Leukocytes, UA: NEGATIVE
NITRITE UA: NEGATIVE
PH UA: 6
PROTEIN UA: NEGATIVE
Spec Grav, UA: 1.01
Urobilinogen, UA: 0.2

## 2014-05-11 MED ORDER — AMLODIPINE BESYLATE 2.5 MG PO TABS: 2.5000 mg | ORAL_TABLET | Freq: Every day | ORAL | Status: DC

## 2014-05-11 NOTE — Progress Notes (Signed)
   Subjective:    Patient ID: Samuel Valencia, male    DOB: September 18, 1975, 39 y.o.   MRN: [id number]  HPI Samuel Valencia is a 39 year old male nonsmoker who comes in today for evaluation of hypertension  When he was here last time we increased his lisinopril from 20 mg a day to 40 because his blood pressure was elevated. Blood pressure did not drop at all.   Review of Systems Review of systems otherwise negative    Objective:   Physical Exam  Well-developed well-nourished male no acute distress vital signs stable he's afebrile BP right arm sitting position 140/98      Assessment & Plan:  Hypertension not at goal......... continue lisinopril 40 mg a day add Norvasc 2.5 mg a day follow-up in one month

## 2014-05-11 NOTE — Progress Notes (Signed)
Pre visit review using our clinic review tool, if applicable. No additional management support is needed unless otherwise documented below in the visit note. 

## 2014-05-11 NOTE — Patient Instructions (Signed)
Lisinopril 40 mg daily in the morning  Norvasc 2.5 mg 1 daily in the morning  Check your blood pressure daily in the morning  Return in one month for follow-up

## 2014-06-22 ENCOUNTER — Ambulatory Visit: Payer: PRIVATE HEALTH INSURANCE | Admitting: Family Medicine

## 2014-07-06 ENCOUNTER — Ambulatory Visit (INDEPENDENT_AMBULATORY_CARE_PROVIDER_SITE_OTHER): Payer: PRIVATE HEALTH INSURANCE | Admitting: Family Medicine

## 2014-07-06 ENCOUNTER — Encounter: Payer: Self-pay | Admitting: Family Medicine

## 2014-07-06 VITALS — BP 140/90 | Temp 98.0°F | Wt 245.0 lb

## 2014-07-06 DIAGNOSIS — I10 Essential (primary) hypertension: Secondary | ICD-10-CM | POA: Diagnosis not present

## 2014-07-06 MED ORDER — AMLODIPINE BESYLATE 5 MG PO TABS: 5.0000 mg | ORAL_TABLET | Freq: Every day | ORAL | Status: DC

## 2014-07-06 NOTE — Progress Notes (Signed)
   Subjective:    Patient ID: Samuel Valencia, male    DOB: 01/17/1976, 39 y.o.   MRN: [id number]  HPI Samuel Valencia is a 39 year old married male nonsmoker who comes in today for reevaluation of hypertension. On Norvasc 2.5 mg daily and lisinopril 40 mg daily BP is dropped to 140/90. It was 150/100. No side effects from medication   Review of Systems Review of systems otherwise negative    Objective:   Physical Exam well-developed well-nourished male no acute distress vital signs stable is afebrile BP right arm sitting position 140/90      Assessment & Plan:  Hypertension approaching goal.......... increase Norvasc to 5 mg daily follow-up in one month if BP continues to be elevated above goal

## 2014-07-06 NOTE — Progress Notes (Signed)
   Subjective:    Patient ID: Samuel Valencia, male    DOB: 05-16-1975, 39 y.o.   MRN: [id number]  HPI    Review of Systems     Objective:   Physical Exam        Assessment & Plan:

## 2014-07-06 NOTE — Patient Instructions (Signed)
Norvasc 5 mg daily  Lisinopril 40 mg daily  Check your morning blood pressure daily for about one month  BP goal 135/85 or less........... if not at goal after one month call and leave a voicemail with Apolonio Schneiders extension [phone]  If your blood pressure is at goal.......... continue current medicine.......... BP check weekly....... follow-up physical exam January 2017.

## 2015-07-16 ENCOUNTER — Other Ambulatory Visit: Payer: Self-pay | Admitting: Family Medicine

## 2015-11-06 ENCOUNTER — Other Ambulatory Visit: Payer: Self-pay | Admitting: Family Medicine

## 2016-04-09 ENCOUNTER — Other Ambulatory Visit: Payer: Self-pay | Admitting: Family Medicine

## 2017-05-04 ENCOUNTER — Other Ambulatory Visit: Payer: Self-pay | Admitting: Family Medicine

## 2017-05-04 NOTE — Telephone Encounter (Signed)
Attempted to reach the pt but no option of leaving a message, Pt needs an OV to refill his medication, will try back.

## 2017-05-09 ENCOUNTER — Ambulatory Visit (INDEPENDENT_AMBULATORY_CARE_PROVIDER_SITE_OTHER): Payer: PRIVATE HEALTH INSURANCE | Admitting: Family Medicine

## 2017-05-09 ENCOUNTER — Encounter: Payer: Self-pay | Admitting: Family Medicine

## 2017-05-09 VITALS — BP 138/102 | HR 80 | Temp 98.3°F | Ht 71.0 in | Wt 250.0 lb

## 2017-05-09 DIAGNOSIS — J351 Hypertrophy of tonsils: Secondary | ICD-10-CM | POA: Diagnosis not present

## 2017-05-09 DIAGNOSIS — Z0001 Encounter for general adult medical examination with abnormal findings: Secondary | ICD-10-CM | POA: Diagnosis not present

## 2017-05-09 DIAGNOSIS — Z23 Encounter for immunization: Secondary | ICD-10-CM | POA: Diagnosis not present

## 2017-05-09 DIAGNOSIS — Z125 Encounter for screening for malignant neoplasm of prostate: Secondary | ICD-10-CM | POA: Diagnosis not present

## 2017-05-09 DIAGNOSIS — I10 Essential (primary) hypertension: Secondary | ICD-10-CM

## 2017-05-09 DIAGNOSIS — Z0000 Encounter for general adult medical examination without abnormal findings: Secondary | ICD-10-CM | POA: Insufficient documentation

## 2017-05-09 LAB — CBC WITH DIFFERENTIAL/PLATELET
BASOS ABS: 0 10*3/uL (ref 0.0–0.1)
Basophils Relative: 0.6 % (ref 0.0–3.0)
EOS PCT: 1.7 % (ref 0.0–5.0)
Eosinophils Absolute: 0.1 10*3/uL (ref 0.0–0.7)
HEMATOCRIT: 44.3 % (ref 39.0–52.0)
Hemoglobin: 15.3 g/dL (ref 13.0–17.0)
LYMPHS PCT: 33.6 % (ref 12.0–46.0)
Lymphs Abs: 1.7 10*3/uL (ref 0.7–4.0)
MCHC: 34.5 g/dL (ref 30.0–36.0)
MCV: 92.6 fl (ref 78.0–100.0)
MONOS PCT: 13.2 % — AB (ref 3.0–12.0)
Monocytes Absolute: 0.7 10*3/uL (ref 0.1–1.0)
NEUTROS ABS: 2.5 10*3/uL (ref 1.4–7.7)
Neutrophils Relative %: 50.9 % (ref 43.0–77.0)
Platelets: 218 10*3/uL (ref 150.0–400.0)
RBC: 4.78 Mil/uL (ref 4.22–5.81)
RDW: 13.2 % (ref 11.5–15.5)
WBC: 4.9 10*3/uL (ref 4.0–10.5)

## 2017-05-09 LAB — PSA: PSA: 0.55 ng/mL (ref 0.10–4.00)

## 2017-05-09 LAB — HEPATIC FUNCTION PANEL
ALK PHOS: 54 U/L (ref 39–117)
ALT: 28 U/L (ref 0–53)
AST: 21 U/L (ref 0–37)
Albumin: 4.1 g/dL (ref 3.5–5.2)
BILIRUBIN DIRECT: 0.2 mg/dL (ref 0.0–0.3)
BILIRUBIN TOTAL: 1.3 mg/dL — AB (ref 0.2–1.2)
Total Protein: 7.2 g/dL (ref 6.0–8.3)

## 2017-05-09 LAB — POCT URINALYSIS DIPSTICK
Bilirubin, UA: NEGATIVE
Blood, UA: NEGATIVE
Glucose, UA: NEGATIVE
Ketones, UA: NEGATIVE
LEUKOCYTES UA: NEGATIVE
NITRITE UA: NEGATIVE
ODOR: NEGATIVE
Spec Grav, UA: 1.015 (ref 1.010–1.025)
Urobilinogen, UA: 0.2 E.U./dL
pH, UA: 8 (ref 5.0–8.0)

## 2017-05-09 LAB — LIPID PANEL
CHOL/HDL RATIO: 5
Cholesterol: 159 mg/dL (ref 0–200)
HDL: 31.1 mg/dL — AB (ref >39.00)
LDL CALC: 95 mg/dL (ref 0–99)
NONHDL: 127.44
TRIGLYCERIDES: 161 mg/dL — AB (ref 0.0–149.0)
VLDL: 32.2 mg/dL (ref 0.0–40.0)

## 2017-05-09 LAB — BASIC METABOLIC PANEL
BUN: 12 mg/dL (ref 6–23)
CO2: 30 mEq/L (ref 19–32)
Calcium: 9.2 mg/dL (ref 8.4–10.5)
Chloride: 105 mEq/L (ref 96–112)
Creatinine, Ser: 0.94 mg/dL (ref 0.40–1.50)
GFR: 112.15 mL/min (ref >60.00)
Glucose, Bld: 103 mg/dL — ABNORMAL HIGH (ref 70–99)
POTASSIUM: 3.6 meq/L (ref 3.5–5.1)
Sodium: 142 mEq/L (ref 135–145)

## 2017-05-09 LAB — TSH: TSH: 0.53 u[IU]/mL (ref 0.35–4.50)

## 2017-05-09 MED ORDER — LISINOPRIL-HYDROCHLOROTHIAZIDE 20-12.5 MG PO TABS: 1.0000 | ORAL_TABLET | Freq: Every day | ORAL | 4 refills | Status: DC

## 2017-05-09 NOTE — Patient Instructions (Signed)
Norvasc 5 mg.......Marland Kitchen 1 daily in the morning  Zestoretic 20-12 0.5.......Marland Kitchen 1 daily in the morning  Check your blood pressure twice daily  Return in 4 weeks for follow-up with all your blood pressure readings and your cuff   We'll set you up a consult with Dr. Melissa Montane  at [facility] ear nose and throat to evaluate your enlarged tonsils  Work hard on your diet and exercise.............. no carbs.......... no salt

## 2017-05-09 NOTE — Progress Notes (Signed)
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 who comes in today for annual physical examination because of a history of hypertension  He was on Norvasc 7.5 mg daily along with lisinopril 40 mg daily. He's been off his medicine now for about 10 days he ran out.  BP today 138 of 102  He gets routine eye care, dental care, colonoscopy not until age 24. No family history of colon cancer polyps.  He states he's having difficulty with enlarged tonsils. He notices difficulty swallowing also difficulty breathing in his wife says he snores a lot at night when he lies on his back. His weight is unchanged at 250 pounds. Height 5 foot 11.  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 21 year old daughter and 76-year-old son at home. Wife works Care[employer] adviser.  EKG was done because of a history of hypertension P. EKG was normal and unchanged  BP (!) 138/102 (BP Location: Left Arm, Patient Position: Sitting, Cuff Size: Large)   Pulse 80   Temp 98.3 F (36.8 C) (Oral)   Ht 5\' 11"  (1.803 m)   Wt 250 lb (113.4 kg)   BMI 34.87 kg/m  Well-developed well-nourished overweight male no acute distress vital signs stable he is afebrile examination HEENT was pertinent his tonsils are markedly enlarged. He has a very small airway. Neck was supple thyroid is not enlarged no carotid bruits. Cardiopulmonary exam normal Dahms abnormal genitalia normal circumcised no ethanol stool guaiac negative prostate smooth nonnodular 1+ BPH......... admits to nocturia 2........ extremities normal skin normal peripheral pulses normal......Marland Kitchen except for 1+ bilateral peripheral edema #1 hypertension not at goal.............. advised  to restart his medication well a low-dose diuretic to the lisinopril.  #2 bilateral enlarged tonsils with symptoms of dysphagia snoring and choking swallowing food///////// recommend he consider a tonsillectomy.......... consult with Dr. Janace Hoard  #3 overweight............ discussed diet exercise and weight loss.

## 2017-05-21 DIAGNOSIS — R1314 Dysphagia, pharyngoesophageal phase: Secondary | ICD-10-CM | POA: Insufficient documentation

## 2017-05-21 DIAGNOSIS — D1039 Benign neoplasm of other parts of mouth: Secondary | ICD-10-CM | POA: Insufficient documentation

## 2017-05-21 DIAGNOSIS — K219 Gastro-esophageal reflux disease without esophagitis: Secondary | ICD-10-CM | POA: Insufficient documentation

## 2018-11-11 ENCOUNTER — Telehealth: Payer: Self-pay | Admitting: Family Medicine

## 2018-11-11 NOTE — Telephone Encounter (Signed)
Pt called in to be advised. Pt says that he was a Dr. Sherren Mocha pt and he is completely out of his medication. Pt says that he doesn't have any health insurance and would like to know if anyone has any suggestions on what he could do to get assistance with his medications? Pt says that he is taking bp medications that he is concerned about.   Please assist. [phone]

## 2018-11-11 NOTE — Telephone Encounter (Signed)
See note

## 2018-11-12 NOTE — Telephone Encounter (Signed)
Left message on machine for patient to call the health department or community health and wellness.

## 2021-07-03 ENCOUNTER — Other Ambulatory Visit: Payer: Self-pay

## 2021-07-03 ENCOUNTER — Emergency Department (HOSPITAL_COMMUNITY)
Admission: EM | Admit: 2021-07-03 | Discharge: 2021-07-03 | Disposition: A | Discharge status: Home / Self Care | Payer: Commercial Managed Care - PPO | Attending: Emergency Medicine | Admitting: Emergency Medicine

## 2021-07-03 ENCOUNTER — Emergency Department (HOSPITAL_COMMUNITY): Payer: Commercial Managed Care - PPO

## 2021-07-03 ENCOUNTER — Encounter (HOSPITAL_COMMUNITY): Payer: Self-pay | Admitting: *Deleted

## 2021-07-03 DIAGNOSIS — S199XXA Unspecified injury of neck, initial encounter: Secondary | ICD-10-CM | POA: Diagnosis present

## 2021-07-03 DIAGNOSIS — S4992XA Unspecified injury of left shoulder and upper arm, initial encounter: Secondary | ICD-10-CM | POA: Insufficient documentation

## 2021-07-03 DIAGNOSIS — S161XXA Strain of muscle, fascia and tendon at neck level, initial encounter: Secondary | ICD-10-CM | POA: Diagnosis not present

## 2021-07-03 DIAGNOSIS — I10 Essential (primary) hypertension: Secondary | ICD-10-CM | POA: Insufficient documentation

## 2021-07-03 DIAGNOSIS — Y92410 Unspecified street and highway as the place of occurrence of the external cause: Secondary | ICD-10-CM | POA: Insufficient documentation

## 2021-07-03 DIAGNOSIS — Z79899 Other long term (current) drug therapy: Secondary | ICD-10-CM | POA: Insufficient documentation

## 2021-07-03 MED ORDER — METHOCARBAMOL 750 MG PO TABS: 750.0000 mg | ORAL_TABLET | Freq: Three times a day (TID) | ORAL | 0 refills | Status: DC | PRN

## 2021-07-03 MED ORDER — ACETAMINOPHEN 500 MG PO TABS
1000.0000 mg | ORAL_TABLET | Freq: Once | ORAL | Status: AC
  Administered 2021-07-03: 1000 mg via ORAL
  Filled 2021-07-03: qty 2

## 2021-07-03 NOTE — Discharge Instructions (Addendum)
It was our pleasure to provide your ER care today - we hope that you feel better. ? ?Take acetaminophen or ibuprofen as need for pain. You may also take robaxin as need for muscle pain/spasm - no driving when taking.   Your ct scan shows no fracture - mild degenerative changes were noted.  ? ?Follow up with primary care doctor in 1-2 weeks - your blood pressure is high today, have it rechecked then, and if high, discuss re-starting blood pressure meds.  ? ?Return to ER if worse, new symptoms, fevers, new/severe pain, or other concern.  ?
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
## 2021-07-06 ENCOUNTER — Ambulatory Visit (INDEPENDENT_AMBULATORY_CARE_PROVIDER_SITE_OTHER): Payer: Commercial Managed Care - PPO | Admitting: Family Medicine

## 2021-07-06 ENCOUNTER — Encounter: Payer: Self-pay | Admitting: Family Medicine

## 2021-07-06 VITALS — BP 165/104 | HR 103 | Temp 98.5°F | Ht 71.75 in | Wt 286.6 lb

## 2021-07-06 DIAGNOSIS — Z7689 Persons encountering health services in other specified circumstances: Secondary | ICD-10-CM

## 2021-07-06 DIAGNOSIS — R0683 Snoring: Secondary | ICD-10-CM | POA: Diagnosis not present

## 2021-07-06 DIAGNOSIS — K219 Gastro-esophageal reflux disease without esophagitis: Secondary | ICD-10-CM

## 2021-07-06 DIAGNOSIS — R7303 Prediabetes: Secondary | ICD-10-CM

## 2021-07-06 DIAGNOSIS — R351 Nocturia: Secondary | ICD-10-CM | POA: Diagnosis not present

## 2021-07-06 DIAGNOSIS — Z1211 Encounter for screening for malignant neoplasm of colon: Secondary | ICD-10-CM

## 2021-07-06 DIAGNOSIS — I10 Essential (primary) hypertension: Secondary | ICD-10-CM

## 2021-07-06 LAB — POCT GLYCOSYLATED HEMOGLOBIN (HGB A1C): Hemoglobin A1C: 6.3 % — AB (ref 4.0–5.6)

## 2021-07-06 LAB — POCT URINALYSIS DIPSTICK
Bilirubin, UA: NEGATIVE
Blood, UA: NEGATIVE
Glucose, UA: NEGATIVE
Ketones, UA: NEGATIVE
Leukocytes, UA: NEGATIVE
Nitrite, UA: NEGATIVE
Protein, UA: POSITIVE — AB
Spec Grav, UA: 1.02 (ref 1.010–1.025)
Urobilinogen, UA: NEGATIVE E.U./dL — AB
pH, UA: 6 (ref 5.0–8.0)

## 2021-07-06 MED ORDER — CHLORTHALIDONE 25 MG PO TABS: 25.0000 mg | ORAL_TABLET | Freq: Every day | ORAL | 1 refills | Status: DC

## 2021-07-06 NOTE — Patient Instructions (Addendum)
The goal for your blood pressure is less than 130/80. ? ?A prescription for chlorthalidone 25 mg was sent to your pharmacy.  This is a medication for blood pressure.  It is similar to the hydrochlorothiazide you were previously taking. ? ? ?Your hemoglobin A1c which is an average of your blood sugar over the last 3 months is 6.3%.  This places you in the prediabetic range.  6.5% and higher is diabetes.  It is important that you decrease the amount of sweets and carbohydrates you are eating as well as increasing your physical activity to lose weight. ? ?A referral for sleep study and for colonoscopy were both placed.  You should expect a phone call about scheduling these appointments. ? ?We will have you follow-up in clinic in the next 4 to 6 weeks,  sooner if needed. ? ?
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
## 2021-07-07 ENCOUNTER — Telehealth: Payer: Self-pay | Admitting: Family Medicine

## 2021-07-07 NOTE — Telephone Encounter (Signed)
Spoke with pt, he rec'd Moderna vaccines. Chart updated. ?

## 2021-07-07 NOTE — Telephone Encounter (Signed)
Pt call and stated he got his first covid shot on 03/17/19 and # 2 on 04/14/19. ?

## 2021-08-10 ENCOUNTER — Ambulatory Visit (INDEPENDENT_AMBULATORY_CARE_PROVIDER_SITE_OTHER): Payer: Commercial Managed Care - PPO | Admitting: Family Medicine

## 2021-08-10 ENCOUNTER — Encounter: Payer: Self-pay | Admitting: Family Medicine

## 2021-08-10 VITALS — BP 150/92 | HR 80 | Temp 98.5°F | Ht 71.75 in | Wt 288.0 lb

## 2021-08-10 DIAGNOSIS — I10 Essential (primary) hypertension: Secondary | ICD-10-CM

## 2021-08-10 DIAGNOSIS — Z1159 Encounter for screening for other viral diseases: Secondary | ICD-10-CM

## 2021-08-10 DIAGNOSIS — Z0000 Encounter for general adult medical examination without abnormal findings: Secondary | ICD-10-CM

## 2021-08-10 DIAGNOSIS — Z125 Encounter for screening for malignant neoplasm of prostate: Secondary | ICD-10-CM | POA: Diagnosis not present

## 2021-08-10 DIAGNOSIS — Z6839 Body mass index (BMI) 39.0-39.9, adult: Secondary | ICD-10-CM

## 2021-08-10 DIAGNOSIS — Z1211 Encounter for screening for malignant neoplasm of colon: Secondary | ICD-10-CM

## 2021-08-10 DIAGNOSIS — Z113 Encounter for screening for infections with a predominantly sexual mode of transmission: Secondary | ICD-10-CM

## 2021-08-10 LAB — LIPID PANEL
Cholesterol: 157 mg/dL (ref 0–200)
HDL: 29.2 mg/dL — ABNORMAL LOW (ref >39.00)
NonHDL: 127.56
Total CHOL/HDL Ratio: 5
Triglycerides: 297 mg/dL — ABNORMAL HIGH (ref 0.0–149.0)
VLDL: 59.4 mg/dL — ABNORMAL HIGH (ref 0.0–40.0)

## 2021-08-10 LAB — COMPREHENSIVE METABOLIC PANEL
ALT: 29 U/L (ref 0–53)
AST: 25 U/L (ref 0–37)
Albumin: 4.4 g/dL (ref 3.5–5.2)
Alkaline Phosphatase: 53 U/L (ref 39–117)
BUN: 19 mg/dL (ref 6–23)
CO2: 31 mEq/L (ref 19–32)
Calcium: 9.5 mg/dL (ref 8.4–10.5)
Chloride: 98 mEq/L (ref 96–112)
Creatinine, Ser: 1.29 mg/dL (ref 0.40–1.50)
GFR: 65.71 mL/min (ref >60.00)
Glucose, Bld: 141 mg/dL — ABNORMAL HIGH (ref 70–99)
Potassium: 3 mEq/L — ABNORMAL LOW (ref 3.5–5.1)
Sodium: 141 mEq/L (ref 135–145)
Total Bilirubin: 0.6 mg/dL (ref 0.2–1.2)
Total Protein: 7.8 g/dL (ref 6.0–8.3)

## 2021-08-10 LAB — CBC WITH DIFFERENTIAL/PLATELET
Basophils Absolute: 0 10*3/uL (ref 0.0–0.1)
Basophils Relative: 0.4 % (ref 0.0–3.0)
Eosinophils Absolute: 0.1 10*3/uL (ref 0.0–0.7)
Eosinophils Relative: 1.4 % (ref 0.0–5.0)
HCT: 43.8 % (ref 39.0–52.0)
Hemoglobin: 15 g/dL (ref 13.0–17.0)
Lymphocytes Relative: 35.2 % (ref 12.0–46.0)
Lymphs Abs: 2.6 10*3/uL (ref 0.7–4.0)
MCHC: 34.3 g/dL (ref 30.0–36.0)
MCV: 93.9 fl (ref 78.0–100.0)
Monocytes Absolute: 0.8 10*3/uL (ref 0.1–1.0)
Monocytes Relative: 11 % (ref 3.0–12.0)
Neutro Abs: 3.9 10*3/uL (ref 1.4–7.7)
Neutrophils Relative %: 52 % (ref 43.0–77.0)
Platelets: 198 10*3/uL (ref 150.0–400.0)
RBC: 4.67 Mil/uL (ref 4.22–5.81)
RDW: 13 % (ref 11.5–15.5)
WBC: 7.5 10*3/uL (ref 4.0–10.5)

## 2021-08-10 LAB — T4, FREE: Free T4: 0.9 ng/dL (ref 0.60–1.60)

## 2021-08-10 LAB — PSA: PSA: 0.51 ng/mL (ref 0.10–4.00)

## 2021-08-10 LAB — TSH: TSH: 0.77 u[IU]/mL (ref 0.35–5.50)

## 2021-08-10 LAB — HEMOGLOBIN A1C: Hgb A1c MFr Bld: 7.2 % — ABNORMAL HIGH (ref 4.6–6.5)

## 2021-08-10 LAB — LDL CHOLESTEROL, DIRECT: Direct LDL: 75 mg/dL

## 2021-08-10 MED ORDER — LISINOPRIL 10 MG PO TABS: 10.0000 mg | ORAL_TABLET | Freq: Every day | ORAL | 3 refills | Status: DC

## 2021-08-10 NOTE — Patient Instructions (Signed)
A prescription for lisinopril 10 mg daily was sent to your pharmacy.  This is another blood pressure medication that you take in addition to chlorthalidone 25 mg daily.  Continue working on diet and exercise changes.  We will have you follow-up in clinic in 1 month for blood pressure.
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
## 2021-08-11 ENCOUNTER — Other Ambulatory Visit: Payer: Self-pay | Admitting: Family Medicine

## 2021-08-11 DIAGNOSIS — E876 Hypokalemia: Secondary | ICD-10-CM

## 2021-08-11 LAB — HIV ANTIBODY (ROUTINE TESTING W REFLEX): HIV 1&2 Ab, 4th Generation: NONREACTIVE

## 2021-08-11 LAB — C. TRACHOMATIS/N. GONORRHOEAE RNA
C. trachomatis RNA, TMA: NOT DETECTED
N. gonorrhoeae RNA, TMA: NOT DETECTED

## 2021-08-11 LAB — HEPATITIS C ANTIBODY
Hepatitis C Ab: NONREACTIVE
SIGNAL TO CUT-OFF: 0.08 (ref <1.00)

## 2021-08-11 LAB — RPR: RPR Ser Ql: NONREACTIVE

## 2021-08-11 MED ORDER — POTASSIUM CHLORIDE CRYS ER 20 MEQ PO TBCR: EXTENDED_RELEASE_TABLET | ORAL | 0 refills | Status: DC

## 2021-08-13 ENCOUNTER — Other Ambulatory Visit: Payer: Self-pay | Admitting: Family Medicine

## 2021-08-13 DIAGNOSIS — E876 Hypokalemia: Secondary | ICD-10-CM

## 2021-08-31 ENCOUNTER — Encounter: Payer: Self-pay | Admitting: Neurology

## 2021-08-31 ENCOUNTER — Ambulatory Visit: Payer: Commercial Managed Care - PPO | Admitting: Neurology

## 2021-08-31 VITALS — BP 146/90 | HR 80 | Ht 71.0 in | Wt 291.2 lb

## 2021-08-31 DIAGNOSIS — R030 Elevated blood-pressure reading, without diagnosis of hypertension: Secondary | ICD-10-CM | POA: Diagnosis not present

## 2021-08-31 DIAGNOSIS — I10 Essential (primary) hypertension: Secondary | ICD-10-CM

## 2021-08-31 DIAGNOSIS — G4719 Other hypersomnia: Secondary | ICD-10-CM | POA: Diagnosis not present

## 2021-08-31 DIAGNOSIS — R519 Headache, unspecified: Secondary | ICD-10-CM

## 2021-08-31 DIAGNOSIS — R351 Nocturia: Secondary | ICD-10-CM

## 2021-08-31 DIAGNOSIS — R0683 Snoring: Secondary | ICD-10-CM

## 2021-08-31 DIAGNOSIS — Z72820 Sleep deprivation: Secondary | ICD-10-CM

## 2021-08-31 DIAGNOSIS — R0689 Other abnormalities of breathing: Secondary | ICD-10-CM

## 2021-08-31 DIAGNOSIS — Z9189 Other specified personal risk factors, not elsewhere classified: Secondary | ICD-10-CM

## 2021-08-31 NOTE — Patient Instructions (Signed)
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
## 2021-09-09 ENCOUNTER — Encounter: Payer: Self-pay | Admitting: Internal Medicine

## 2021-09-16 ENCOUNTER — Ambulatory Visit (AMBULATORY_SURGERY_CENTER): Payer: Self-pay | Admitting: *Deleted

## 2021-09-16 VITALS — Ht 71.0 in | Wt 289.0 lb

## 2021-09-16 DIAGNOSIS — Z1211 Encounter for screening for malignant neoplasm of colon: Secondary | ICD-10-CM

## 2021-09-16 MED ORDER — NA SULFATE-K SULFATE-MG SULF 17.5-3.13-1.6 GM/177ML PO SOLN: 2.0000 | Freq: Once | ORAL | 0 refills | Status: AC

## 2021-09-16 NOTE — Progress Notes (Signed)

## 2021-09-21 ENCOUNTER — Telehealth: Payer: Self-pay | Admitting: Neurology

## 2021-09-21 NOTE — Telephone Encounter (Signed)
NPSG- UMR no auth ref # Samuel Valencia on 09/20/21. Patient is scheduled at [facility] for 10/12/21 at 8 pm. I also mailed the packet out to the patient as well.

## 2021-09-26 ENCOUNTER — Ambulatory Visit (INDEPENDENT_AMBULATORY_CARE_PROVIDER_SITE_OTHER): Payer: Commercial Managed Care - PPO | Admitting: Neurology

## 2021-09-26 DIAGNOSIS — G4720 Circadian rhythm sleep disorder, unspecified type: Secondary | ICD-10-CM

## 2021-09-26 DIAGNOSIS — G4730 Sleep apnea, unspecified: Secondary | ICD-10-CM

## 2021-09-26 DIAGNOSIS — R0689 Other abnormalities of breathing: Secondary | ICD-10-CM

## 2021-09-26 DIAGNOSIS — R351 Nocturia: Secondary | ICD-10-CM

## 2021-09-26 DIAGNOSIS — R519 Headache, unspecified: Secondary | ICD-10-CM

## 2021-09-26 DIAGNOSIS — Z9189 Other specified personal risk factors, not elsewhere classified: Secondary | ICD-10-CM

## 2021-09-26 DIAGNOSIS — R0683 Snoring: Secondary | ICD-10-CM | POA: Diagnosis not present

## 2021-09-26 DIAGNOSIS — G4719 Other hypersomnia: Secondary | ICD-10-CM

## 2021-09-26 DIAGNOSIS — Z72820 Sleep deprivation: Secondary | ICD-10-CM

## 2021-09-26 DIAGNOSIS — G4734 Idiopathic sleep related nonobstructive alveolar hypoventilation: Secondary | ICD-10-CM

## 2021-09-26 DIAGNOSIS — I10 Essential (primary) hypertension: Secondary | ICD-10-CM

## 2021-09-26 DIAGNOSIS — G4731 Primary central sleep apnea: Secondary | ICD-10-CM

## 2021-09-26 DIAGNOSIS — Z789 Other specified health status: Secondary | ICD-10-CM

## 2021-09-26 DIAGNOSIS — R030 Elevated blood-pressure reading, without diagnosis of hypertension: Secondary | ICD-10-CM

## 2021-09-28 ENCOUNTER — Other Ambulatory Visit: Payer: Self-pay | Admitting: Family Medicine

## 2021-09-28 DIAGNOSIS — E876 Hypokalemia: Secondary | ICD-10-CM

## 2021-10-03 NOTE — Addendum Note (Signed)
Addended by: Star Age on: 10/03/2021 05:14 PM   Modules accepted: Orders

## 2021-10-03 NOTE — Procedures (Signed)
PATIENT'S NAME:  Samuel Valencia, Samuel Valencia DOB:      01-Oct-1975      MR#:    [id number]     DATE OF RECORDING: 09/26/2021 REFERRING M.D.:  Grier Mitts, MD Study Performed:  Split-Night Titration Study HISTORY: 46 year old man with a history of prediabetes, uncontrolled hypertension, reflux disease, and severe obesity with a BMI of over 40, who reports snoring and excessive daytime somnolence. The patient endorsed the Epworth Sleepiness Scale at 9 points. The patient's weight 291 pounds with a height of 71 (inches), resulting in a BMI of 40.7 kg/m2. The patient's neck circumference measured 19.5 inches.  CURRENT MEDICATIONS: Hygroton, Zestril, Robaxin, Klor-Con M  PROCEDURE:  This is a multichannel digital polysomnogram utilizing the Somnostar 11.2 system.  Electrodes and sensors were applied and monitored per AASM Specifications.   EEG, EOG, Chin and Limb EMG, were sampled at 200 Hz.  ECG, Snore and Nasal Pressure, Thermal Airflow, Respiratory Effort, CPAP Flow and Pressure, Oximetry was sampled at 50 Hz. Digital video and audio were recorded.      BASELINE STUDY WITHOUT CPAP RESULTS:  Lights Out was at 21:24 and Lights On at 05:00 for the night, split study start at 23:49, epoch 297. He qualified for an emergency split study due to a high AHI of 81.3/hour and critical desaturations, as low as 62%. Total recording time (TRT) was 164, with a total sleep time (TST) of 124 minutes.   The patient's sleep latency was 13 minutes.  REM latency was 103 minutes.  The sleep efficiency was 75.6 %.    SLEEP ARCHITECTURE: WASO (Wake after sleep onset) was 16 minutes with moderate sleep fragmentation noted. Stage N1 was 11 minutes, Stage N2 was 101 minutes, Stage N3 was 0 minutes and Stage R (REM sleep) was 12 minutes.  The percentages were Stage N1 8.9%, Stage N2 81.5%, which is increased, Stage N3 was absent and Stage R (REM sleep) 9.7%. The arousals were noted as: 6 were spontaneous, 0 were associated with PLMs, 68 were  associated with respiratory events.  RESPIRATORY ANALYSIS:  There were a total of 168 respiratory events:  41 obstructive apneas, 31 central apneas and 74 mixed apneas with a total of 146 apneas and an apnea index (AI) of 70.6. There were 22 hypopneas with a hypopnea index of 10.6. The patient also had 0 respiratory event related arousals (RERAs).  Snoring was noted.     The total APNEA/HYPOPNEA INDEX (AHI) was 81.3 /hour and the total RESPIRATORY DISTURBANCE INDEX was 81.3 /hour.  12 events occurred in REM sleep and 139 events in NREM. The REM AHI was 60, /hour versus a non-REM AHI of 83.6 /hour. The patient spent 327 minutes sleep time in the supine position 57 minutes in non-supine. The supine AHI was 78.1 /hour versus a non-supine AHI of 92.1 /hour.  OXYGEN SATURATION & C02:  The wake baseline 02 saturation was 94%, with the lowest being 62%. Time spent below 89% saturation equaled 117 minutes.  PERIODIC LIMB MOVEMENTS: The patient had a total of 0 Periodic Limb Movements.  The Periodic Limb Movement (PLM) index was 0 /hour and the PLM Arousal index was 0 /hour.  Audio and video analysis did not show any abnormal or unusual movements, behaviors, phonations or vocalizations. The patient took no bathroom breaks. Moderate snoring was noted. The EKG was in keeping with normal sinus rhythm (NSR).   TITRATION STUDY WITH CPAP RESULTS:   The patient was tried on CPAP but was not able to tolerate  it. He was therefore started on BiPAP standard, with a starting pressure of 10/6 cm via large Simplus FFM. BiPAP was initiated at 10/6 cm with heated humidity per AASM split night standards and pressure was advanced to 20/14 cmH20 because of hypopneas, apneas and desaturations.  At a PAP pressure of 20/14 cm H20, there was a reduction of the AHI to 9.5/hour with with residual central apneas noted and O2 nadir of 81%, and supine REM sleep achieved.   Total recording time (TRT) was 293 minutes, with a total sleep  time (TST) of 259.5 minutes. The patient's sleep latency was 37.5 minutes. REM latency was 109 minutes.  The sleep efficiency was 88.6 %.    SLEEP ARCHITECTURE: Wake after sleep was 21 minutes, Stage N1 12 minutes, Stage N2 167.5 minutes, Stage N3 41.5 minutes and Stage R (REM sleep) 38.5 minutes. The percentages were: Stage N1 4.6%, Stage N2 64.5%, Stage N3 16.% and Stage R (REM sleep) 14.8%.   The arousals were noted as: 16 were spontaneous, 0 were associated with PLMs, 35 were associated with respiratory events.  RESPIRATORY ANALYSIS:  There were a total of 151 respiratory events: 56 obstructive apneas, 36 central apneas and 3 mixed apneas with a total of 95 apneas and an apnea index (AI) of 22.. There were 56 hypopneas with a hypopnea index of 12.9 /hour. The patient also had 0 respiratory event related arousals (RERAs).      The total APNEA/HYPOPNEA INDEX  (AHI) was 34.9 /hour and the total RESPIRATORY DISTURBANCE INDEX was 34.9 /hour.  19 events occurred in REM sleep and 132 events in NREM. The REM AHI was 29.6 /hour versus a non-REM AHI of 35.8 /hour. REM sleep was achieved on a pressure of  cm/h2o (AHI was  .) The patient spent 89% of total sleep time in the supine position. The supine AHI was 29.4 /hour, versus a non-supine AHI of 80.0/hour.  OXYGEN SATURATION & C02:  The wake baseline 02 saturation was 98%, with the lowest being 73%. Time spent below 89% saturation equaled 96 minutes.  PERIODIC LIMB MOVEMENTS:  The patient had a total of 0 Periodic Limb Movements. The Periodic Limb Movement (PLM) index was 0 /hour and the PLM Arousal index was 0 /hour.  Post-study, the patient indicated that sleep was better than usual.   POLYSOMNOGRAPHY IMPRESSION :   Complex Sleep Apnea (OSA), severe Nocturnal hypoxemia  Intolerance of CPAP Dysfunctions associated with sleep stages or arousals from sleep  RECOMMENDATIONS:  This patient has severe complex sleep apnea with obstructive, central and  mixed apneas as well as hypopneas with a baseline AHI of 81.3/hour and critical desaturations in supine REM sleep, nadir of 62%. He had evidence of nocturnal hypoxemia with time below 89% of 213 minutes for the night. He had intolerance of CPAP treatment, but tolerated standard BiPAP up to a pressure of 20/14 cm via large FFM with heated humidity. He still had a mildly elevated AHI of 9.5/hour with desaturations noted. I will ask the patient to start home BiPAP at a higher EPAP at 20/16 cm for now. Concomitant weight loss is highly recommended and, if possible, the patient will be asked to sleep on his sides even with BiPAP treatment. The patient should be reminded to be fully compliant with PAP therapy to improve sleep related symptoms and decrease long term cardiovascular risks. Please note that untreated obstructive sleep apnea may carry additional perioperative morbidity. Patients with significant obstructive sleep apnea should receive perioperative PAP therapy  and the surgeons and particularly the anesthesiologist should be informed of the diagnosis and the severity of the sleep disordered breathing. This study shows sleep fragmentation and abnormal sleep stage percentages; these are nonspecific findings and per se do not signify an intrinsic sleep disorder or a cause for the patient's sleep-related symptoms. Causes include (but are not limited to) the first night effect of the sleep study, circadian rhythm disturbances, medication effect or an underlying mood disorder or medical problem.  The patient should be cautioned not to drive, work at heights, or operate dangerous or heavy equipment when tired or sleepy. Review and reiteration of good sleep hygiene measures should be pursued with any patient. The patient will be seen in follow-up in the sleep clinic at [facility] for discussion of the test results, symptom and treatment compliance review, further management strategies, etc. The referring provider will be  notified of the test results.  I certify that I have reviewed the entire raw data recording prior to the issuance of this report in accordance with the Standards of Accreditation of the American Academy of Sleep Medicine (AASM)  Star Age, MD, PhD Diplomat, American Board of Neurology and Sleep Medicine (Neurology and Sleep Medicine)

## 2021-10-04 ENCOUNTER — Encounter: Payer: Self-pay | Admitting: Internal Medicine

## 2021-10-04 ENCOUNTER — Telehealth: Payer: Self-pay

## 2021-10-04 NOTE — Telephone Encounter (Signed)
I called pt. I advised pt that Dr. Rexene Alberts reviewed their sleep study results and found that pt has severe osa. Dr. Rexene Alberts recommends that pt start BiPAP for treatment. I reviewed PAP compliance expectations with the pt. Pt is agreeable to starting a BiPAP. I advised pt that an order will be sent to a DME, [facility], and [facility] will call the pt within about one week after they file with the pt's insurance. [facility] will show the pt how to use the machine, fit for masks, and troubleshoot the BiPAP if needed. A follow up appt was made for insurance purposes with Dr. Rexene Alberts on 12/29/2021 at 945 am. Pt verbalized understanding to arrive 15 minutes early and bring their BiPAP. A letter with all of this information in it will be mailed to the pt as a reminder. I verified with the pt that the address we have on file is correct. Pt verbalized understanding of results. Pt had no questions at this time but was encouraged to call back if questions arise. I have sent the order to [facility] and have received confirmation that they have received the order.

## 2021-10-04 NOTE — Telephone Encounter (Signed)
-----   Message from Anda Latina, RN sent at 10/04/2021  8:10 AM EDT -----  ----- Message ----- From: Star Age, MD Sent: 10/03/2021   5:14 PM EDT To: Gna[facility] 4 Results  Urgent set up requested d/t severe sleep apnea. Patient referred by Dr. Volanda Napoleon, seen by me on 08/31/21, patient had a split night sleep study on 09/26/21. Please call and notify patient that the recent sleep study showed severe obstructive and central sleep apnea (OSA). He did fairly well with a machine called BiPAP, which is more sophisticated than a standard CPAP machine. He is advised to try to sleep on his side, even with the BiPAP strive for weight loss. I placed the order in the chart for a BiPAP machine.  Please advise patient that we will need a follow up appointment with either myself or one of our nurse practitioners in about 2-3 months post set-up to check for how they are doing on treatment and how well it's going with the machine in general. Most insurance company require a certain compliance percentage to continue to cover/pay for the machine. Please ask patient to schedule this FU appointment, according to the set-up date, which is the day they receive the machine. Please make sure, the patient understands the importance of keeping this window for the FU appointment, as it is often an Designer, industrial/product and not our rule. Failing to adhere to this may result in losing coverage for sleep apnea treatment, at which point some insurances require repeating the whole process. Plus, monitoring compliance data is usually good feedback for the patient as far as how they are doing, how many hours they are on it, how well the mask fits, etc.  Also remind patient, that any PAP machine or mask issues should be first addressed with the DME company, who provided the machine/supplies.  Please ask if patient has a preference regarding DME company, may depend on the insurance too.  Star Age, MD, PhD [facility])

## 2021-10-07 ENCOUNTER — Encounter: Payer: Self-pay | Admitting: Internal Medicine

## 2021-10-07 ENCOUNTER — Ambulatory Visit (AMBULATORY_SURGERY_CENTER): Payer: Commercial Managed Care - PPO | Admitting: Internal Medicine

## 2021-10-07 VITALS — BP 108/75 | HR 74 | Temp 96.0°F | Resp 15 | Ht 71.0 in | Wt 289.0 lb

## 2021-10-07 DIAGNOSIS — Z1211 Encounter for screening for malignant neoplasm of colon: Secondary | ICD-10-CM

## 2021-10-07 DIAGNOSIS — K635 Polyp of colon: Secondary | ICD-10-CM | POA: Diagnosis not present

## 2021-10-07 DIAGNOSIS — D123 Benign neoplasm of transverse colon: Secondary | ICD-10-CM

## 2021-10-07 MED ORDER — SODIUM CHLORIDE 0.9 % IV SOLN: 500.0000 mL | Freq: Once | INTRAVENOUS | Status: DC

## 2021-10-07 NOTE — Progress Notes (Signed)
Pt's states no medical or surgical changes since previsit or office visit. 

## 2021-10-07 NOTE — Progress Notes (Signed)
Sedate, gd SR, tolerated procedure well, VSS, report to RN 

## 2021-10-07 NOTE — Progress Notes (Signed)
Called to room to assist during endoscopic procedure.  Patient ID and intended procedure confirmed with present staff. Received instructions for my participation in the procedure from the performing physician.  

## 2021-10-07 NOTE — Op Note (Signed)
[facility] Patient Name: Samuel Valencia Procedure Date: 10/07/2021 9:46 AM MRN: [id number] Endoscopist: Sonny Masters "Samuel Valencia ,  Age: 46 Referring MD:  Date of Birth: 1975-11-25 Gender: Male Account #: [id number] Procedure:                Colonoscopy Indications:              Screening for colorectal malignant neoplasm, This                            is the patient's first colonoscopy Medicines:                Monitored Anesthesia Care Procedure:                Pre-Anesthesia Assessment:                           - Prior to the procedure, a History and Physical                            was performed, and patient medications and                            allergies were reviewed. The patient's tolerance of                            previous anesthesia was also reviewed. The risks                            and benefits of the procedure and the sedation                            options and risks were discussed with the patient.                            All questions were answered, and informed consent                            was obtained. Prior Anticoagulants: The patient has                            taken no previous anticoagulant or antiplatelet                            agents. ASA Grade Assessment: III - A patient with                            severe systemic disease. After reviewing the risks                            and benefits, the patient was deemed in                            satisfactory condition to undergo the procedure.  After obtaining informed consent, the colonoscope                            was passed under direct vision. Throughout the                            procedure, the patient's blood pressure, pulse, and                            oxygen saturations were monitored continuously. The                            CF [id number] #[id number] was introduced through the anus                            and advanced to  the the terminal ileum. The                            colonoscopy was performed without difficulty. The                            patient tolerated the procedure well. The quality                            of the bowel preparation was good. The terminal                            ileum, ileocecal valve, appendiceal orifice, and                            rectum were photographed. Scope In: 9:54:16 AM Scope Out: 10:10:11 AM Scope Withdrawal Time: 0 hours 12 minutes 33 seconds  Total Procedure Duration: 0 hours 15 minutes 55 seconds  Findings:                 The terminal ileum appeared normal.                           Multiple small and large-mouthed diverticula were                            found in the ascending colon.                           Five sessile polyps were found in the transverse                            colon. The polyps were 3 to 6 mm in size. These                            polyps were removed with a cold snare. Resection                            and retrieval were complete.  Non-bleeding internal hemorrhoids were found during                            retroflexion. Complications:            No immediate complications. Estimated Blood Loss:     Estimated blood loss was minimal. Impression:               - The examined portion of the ileum was normal.                           - Diverticulosis in the ascending colon.                           - Five 3 to 6 mm polyps in the transverse colon,                            removed with a cold snare. Resected and retrieved.                           - Non-bleeding internal hemorrhoids. Recommendation:           - Discharge patient to home (with escort).                           - Await pathology results.                           - The findings and recommendations were discussed                            with the patient. Sonny Masters "Samuel Valencia" [place],  10/07/2021 10:13:58 AM

## 2021-10-07 NOTE — Progress Notes (Signed)
GASTROENTEROLOGY PROCEDURE H&P NOTE   Primary Care Physician: Billie Ruddy, MD    Reason for Procedure:   Colon cancer screening  Plan:    Colonoscopy  Patient is appropriate for endoscopic procedure(s) in the ambulatory ([place]) setting.  The nature of the procedure, as well as the risks, benefits, and alternatives were carefully and thoroughly reviewed with the patient. Ample time for discussion and questions allowed. The patient understood, was satisfied, and agreed to proceed.     HPI: Samuel Valencia is a 46 y.o. male who presents for colonoscopy for colon cancer screening. Denies blood in stools, changes in bowel habits, weight loss. Denies family history of colon cancer.  Past Medical History:  Diagnosis Date   Hypertension    Prediabetes     Past Surgical History:  Procedure Laterality Date   VASECTOMY      Prior to Admission medications   Medication Sig Start Date End Date Taking? Authorizing Provider  amLODipine (NORVASC) 5 MG tablet Take 1 tablet by mouth daily. 05/07/17  Yes [provider]  chlorthalidone (HYGROTON) 25 MG tablet Take 1 tablet (25 mg total) by mouth daily. 07/06/21  Yes Billie Ruddy, MD  lisinopril (ZESTRIL) 10 MG tablet Take 1 tablet (10 mg total) by mouth daily. 08/10/21  Yes Billie Ruddy, MD  APPLE CIDER VINEGAR PO Take by mouth.    [provider]  methocarbamol (ROBAXIN) 750 MG tablet Take 1 tablet (750 mg total) by mouth 3 (three) times daily as needed (muscle spasm/pain). 07/03/21   Lajean Saver, MD  potassium chloride SA (KLOR-CON M) 20 MEQ tablet Take 1 tab twice a day for the next 4 days.  Then  1 tab daion day 5 takely. 08/11/21   Billie Ruddy, MD    Current Outpatient Medications  Medication Sig Dispense Refill   amLODipine (NORVASC) 5 MG tablet Take 1 tablet by mouth daily.     chlorthalidone (HYGROTON) 25 MG tablet Take 1 tablet (25 mg total) by mouth daily. 90 tablet 1   lisinopril (ZESTRIL) 10  MG tablet Take 1 tablet (10 mg total) by mouth daily. 90 tablet 3   APPLE CIDER VINEGAR PO Take by mouth.     methocarbamol (ROBAXIN) 750 MG tablet Take 1 tablet (750 mg total) by mouth 3 (three) times daily as needed (muscle spasm/pain). 15 tablet 0   potassium chloride SA (KLOR-CON M) 20 MEQ tablet Take 1 tab twice a day for the next 4 days.  Then  1 tab daion day 5 takely. 38 tablet 0   Current Facility-Administered Medications  Medication Dose Route Frequency Provider Last Rate Last Admin   0.9 %  sodium chloride infusion  500 mL Intravenous Once Sharyn Creamer, MD        Allergies as of 10/07/2021   (No Known Allergies)    Family History  Problem Relation Age of Onset   Hypertension Mother    Hypertension Father    Hyperlipidemia Father    Diabetes Father    Colon polyps Neg Hx    Colon cancer Neg Hx    Esophageal cancer Neg Hx    Stomach cancer Neg Hx    Rectal cancer Neg Hx     Social History   Socioeconomic History   Marital status: Married    Spouse name: Not on file   Number of children: Not on file   Years of education: Not on file   Highest education level: Not on  file  Occupational History   Not on file  Tobacco Use   Smoking status: Never   Smokeless tobacco: Never  Vaping Use   Vaping Use: Never used  Substance and Sexual Activity   Alcohol use: No   Drug use: No   Sexual activity: Not on file  Other Topics Concern   Not on file  Social History Narrative   Caffiene soda 16 oz.    Working 2 FT Counsellor)   Social Determinants of Radio broadcast assistant Strain: Not on file  Food Insecurity: Not on file  Transportation Needs: Not on file  Physical Activity: Not on file  Stress: Not on file  Social Connections: Not on file  Intimate Partner Violence: Not on file    Physical Exam: Vital signs in last 24 hours: BP 117/68   Pulse 74   Temp (!) 96 F (35.6 C) (Skin)   Ht '5\' 11"'$  (1.803 m)   Wt 289 lb (131.1 kg)   SpO2 97%   BMI 40.31  kg/m  GEN: NAD EYE: Sclerae anicteric ENT: MMM CV: Non-tachycardic Pulm: No increased work of breathing GI: Soft, NT/ND NEURO:  Alert & Oriented   [name], MD [facility]  10/07/2021 9:07 AM

## 2021-10-07 NOTE — Progress Notes (Signed)
1000 Pt obstructing airway, nasal trumpet place, gd's air exchange,O@ SAT's >96%, bloody drainage evident, Dr. Lorenso Courier aware

## 2021-10-07 NOTE — Patient Instructions (Signed)
Discharge instructions given. Handouts on polyps,diverticulosis and hemorrhoids. Resume previous medications. YOU HAD AN ENDOSCOPIC PROCEDURE TODAY AT [facility]:   Refer to the procedure report that was given to you for any specific questions about what was found during the examination.  If the procedure report does not answer your questions, please call your gastroenterologist to clarify.  If you requested that your care partner not be given the details of your procedure findings, then the procedure report has been included in a sealed envelope for you to review at your convenience later.  YOU SHOULD EXPECT: Some feelings of bloating in the abdomen. Passage of more gas than usual.  Walking can help get rid of the air that was put into your GI tract during the procedure and reduce the bloating. If you had a lower endoscopy (such as a colonoscopy or flexible sigmoidoscopy) you may notice spotting of blood in your stool or on the toilet paper. If you underwent a bowel prep for your procedure, you may not have a normal bowel movement for a few days.  Please Note:  You might notice some irritation and congestion in your nose or some drainage.  This is from the oxygen used during your procedure.  There is no need for concern and it should clear up in a day or so.  SYMPTOMS TO REPORT IMMEDIATELY:  Following lower endoscopy (colonoscopy or flexible sigmoidoscopy):  Excessive amounts of blood in the stool  Significant tenderness or worsening of abdominal pains  Swelling of the abdomen that is new, acute  Fever of 100F or higher   For urgent or emergent issues, a gastroenterologist can be reached at any hour by calling [phone]. Do not use MyChart messaging for urgent concerns.    DIET:  We do recommend a small meal at first, but then you may proceed to your regular diet.  Drink plenty of fluids but you should avoid alcoholic beverages for 24 hours.  ACTIVITY:  You should  plan to take it easy for the rest of today and you should NOT DRIVE or use heavy machinery until tomorrow (because of the sedation medicines used during the test).    FOLLOW UP: Our staff will call the number listed on your records the next business day following your procedure.  We will call around 7:15- 8:00 am to check on you and address any questions or concerns that you may have regarding the information given to you following your procedure. If we do not reach you, we will leave a message.  If you develop any symptoms (ie: fever, flu-like symptoms, shortness of breath, cough etc.) before then, please call [phone].  If you test positive for Covid 19 in the 2 weeks post procedure, please call and report this information to [place].    If any biopsies were taken you will be contacted by phone or by letter within the next 1-3 weeks.  Please call us at [phone] if you have not heard about the biopsies in 3 weeks.    SIGNATURES/CONFIDENTIALITY: You and/or your care partner have signed paperwork which will be entered into your electronic medical record.  These signatures attest to the fact that that the information above on your After Visit Summary has been reviewed and is understood.  Full responsibility of the confidentiality of this discharge information lies with you and/or your care-partner.

## 2021-10-10 ENCOUNTER — Telehealth: Payer: Self-pay

## 2021-10-10 NOTE — Telephone Encounter (Signed)
  Follow up Call-     10/07/2021    9:00 AM  Call back number  Post procedure Call Back phone  # [phone]  Permission to leave phone message Yes     Patient questions:  Do you have a fever, pain , or abdominal swelling? No. Pain Score  0 *  Have you tolerated food without any problems? Yes.    Have you been able to return to your normal activities? Yes.    Do you have any questions about your discharge instructions: Diet   No. Medications  No. Follow up visit  No.  Do you have questions or concerns about your Care? No.  Actions: * If pain score is 4 or above: No action needed, pain <4.

## 2021-10-11 ENCOUNTER — Encounter: Payer: Self-pay | Admitting: Internal Medicine

## 2021-10-12 ENCOUNTER — Ambulatory Visit (INDEPENDENT_AMBULATORY_CARE_PROVIDER_SITE_OTHER): Payer: Commercial Managed Care - PPO | Admitting: Family Medicine

## 2021-10-12 VITALS — BP 100/70 | HR 90 | Temp 98.7°F | Wt 284.6 lb

## 2021-10-12 DIAGNOSIS — Z8669 Personal history of other diseases of the nervous system and sense organs: Secondary | ICD-10-CM | POA: Diagnosis not present

## 2021-10-12 DIAGNOSIS — I10 Essential (primary) hypertension: Secondary | ICD-10-CM

## 2021-10-12 DIAGNOSIS — J351 Hypertrophy of tonsils: Secondary | ICD-10-CM | POA: Diagnosis not present

## 2021-10-12 NOTE — Progress Notes (Signed)
Subjective:    Patient ID: Samuel Valencia, male    DOB: 03-28-1975, 46 y.o.   MRN: [id number]  Chief Complaint  Patient presents with   Follow-up   Hypertension    HPI Patient was seen today for f/u on bp.  States feeling good.  Making efforts to loose wt.  Taking last 5 mg and lisinopril 10 mg daily.  Denies headaches, CP, changes in vision  Patient notes continued enlarged tonsils.  Endorses past history of some type of infection on his tonsil causing chronic enlargement.  Per chart review seen by ENT in 2019, noted to have papilloma.  Interested in removal of tonsils.  Past Medical History:  Diagnosis Date   Hypertension    Prediabetes     No Known Allergies  ROS General: Denies fever, chills, night sweats, changes in weight, changes in appetite HEENT: Denies headaches, ear pain, changes in vision, rhinorrhea, sore throat +chronic Tonsil enlargement CV: Denies CP, palpitations, SOB, orthopnea Pulm: Denies SOB, cough, wheezing GI: Denies abdominal pain, nausea, vomiting, diarrhea, constipation GU: Denies dysuria, hematuria, frequency Msk: Denies muscle cramps, joint pains Neuro: Denies weakness, numbness, tingling Skin: Denies rashes, bruising Psych: Denies depression, anxiety, hallucinations     Objective:    Blood pressure 100/70, pulse 90, temperature 98.7 F (37.1 C), temperature source Oral, weight 284 lb 9.6 oz (129.1 kg), SpO2 96 %.  Gen. Pleasant, well-nourished, in no distress, normal affect   HEENT: [place]/AT, face symmetric, conjunctiva clear, no scleral icterus, PERRLA, EOMI, nares patent without drainage, pharynx without erythema or exudate.  Tonsils 1+ L >R. Lungs: no accessory muscle use, CTAB, no wheezes or rales Cardiovascular: RRR, no m/r/g, no peripheral edema Neuro:  A&Ox3, CN II-XII intact, normal gait Skin:  Warm, no lesions/ rash   Wt Readings from Last 3 Encounters:  10/12/21 284 lb 9.6 oz (129.1 kg)  10/07/21 289 lb (131.1 kg)  09/16/21 289  lb (131.1 kg)    Lab Results  Component Value Date   WBC 7.5 08/10/2021   HGB 15.0 08/10/2021   HCT 43.8 08/10/2021   PLT 198.0 08/10/2021   GLUCOSE 141 (H) 08/10/2021   CHOL 157 08/10/2021   TRIG 297.0 (H) 08/10/2021   HDL 29.20 (L) 08/10/2021   LDLDIRECT 75.0 08/10/2021   LDLCALC 95 05/09/2017   ALT 29 08/10/2021   AST 25 08/10/2021   NA 141 08/10/2021   K 3.0 (L) 08/10/2021   CL 98 08/10/2021   CREATININE 1.29 08/10/2021   BUN 19 08/10/2021   CO2 31 08/10/2021   TSH 0.77 08/10/2021   PSA 0.51 08/10/2021   HGBA1C 7.2 (H) 08/10/2021    Assessment/Plan:  Essential hypertension -controlled -significant improvement like due to med compliance and lifestyle modifications contributing to wt loss. -given low normal bps discussed holding norvasc 5 mg and checking bp at home.  For readings Greater than 140/90 can restart Norvasc at half dose.  Tonsillar enlargement  - Plan: Ambulatory referral to ENT  History of papilledema  -noted on tonsils in 2019 by ENT -Patient interested in tonsillectomy.  Refer to ENT. - Plan: Ambulatory referral to ENT  F/u in 1-2 months for HTN  Grier Mitts, MD

## 2021-10-23 ENCOUNTER — Encounter: Payer: Self-pay | Admitting: Family Medicine

## 2021-10-24 ENCOUNTER — Telehealth: Payer: Self-pay | Admitting: Family Medicine

## 2021-10-24 NOTE — Telephone Encounter (Addendum)
Pt called to inform MD that he needs a new referral for the ENT,  because the referral given to him for Dr. Benjamine Mola previously cannot be used, as that MD no longer sees adults.  Please advise.  [phone]

## 2021-10-24 NOTE — Telephone Encounter (Signed)
Dr Benjamine Mola no longer see adults with throat issues only ears and nose

## 2021-10-26 NOTE — Telephone Encounter (Signed)
Spoke with Paula Libra, referral resent for another specialist.

## 2021-11-11 ENCOUNTER — Encounter: Payer: Self-pay | Admitting: Family Medicine

## 2021-11-11 ENCOUNTER — Ambulatory Visit (INDEPENDENT_AMBULATORY_CARE_PROVIDER_SITE_OTHER): Payer: Commercial Managed Care - PPO | Admitting: Family Medicine

## 2021-11-11 ENCOUNTER — Telehealth: Payer: Self-pay | Admitting: Family Medicine

## 2021-11-11 VITALS — BP 132/86 | HR 81 | Temp 98.8°F | Ht 72.0 in | Wt 282.6 lb

## 2021-11-11 DIAGNOSIS — Z6838 Body mass index (BMI) 38.0-38.9, adult: Secondary | ICD-10-CM

## 2021-11-11 DIAGNOSIS — G4733 Obstructive sleep apnea (adult) (pediatric): Secondary | ICD-10-CM

## 2021-11-11 DIAGNOSIS — R7303 Prediabetes: Secondary | ICD-10-CM | POA: Diagnosis not present

## 2021-11-11 DIAGNOSIS — I10 Essential (primary) hypertension: Secondary | ICD-10-CM

## 2021-11-11 DIAGNOSIS — E781 Pure hyperglyceridemia: Secondary | ICD-10-CM

## 2021-11-11 LAB — COMPREHENSIVE METABOLIC PANEL
ALT: 21 U/L (ref 0–53)
AST: 19 U/L (ref 0–37)
Albumin: 4.3 g/dL (ref 3.5–5.2)
Alkaline Phosphatase: 41 U/L (ref 39–117)
BUN: 13 mg/dL (ref 6–23)
CO2: 34 mEq/L — ABNORMAL HIGH (ref 19–32)
Calcium: 9.1 mg/dL (ref 8.4–10.5)
Chloride: 99 mEq/L (ref 96–112)
Creatinine, Ser: 1.03 mg/dL (ref 0.40–1.50)
GFR: 85.93 mL/min (ref >60.00)
Glucose, Bld: 112 mg/dL — ABNORMAL HIGH (ref 70–99)
Potassium: 3 mEq/L — ABNORMAL LOW (ref 3.5–5.1)
Sodium: 140 mEq/L (ref 135–145)
Total Bilirubin: 1 mg/dL (ref 0.2–1.2)
Total Protein: 8.1 g/dL (ref 6.0–8.3)

## 2021-11-11 LAB — CBC WITH DIFFERENTIAL/PLATELET
Basophils Absolute: 0 10*3/uL (ref 0.0–0.1)
Basophils Relative: 0.3 % (ref 0.0–3.0)
Eosinophils Absolute: 0.1 10*3/uL (ref 0.0–0.7)
Eosinophils Relative: 1.5 % (ref 0.0–5.0)
HCT: 41 % (ref 39.0–52.0)
Hemoglobin: 13.9 g/dL (ref 13.0–17.0)
Lymphocytes Relative: 32 % (ref 12.0–46.0)
Lymphs Abs: 2 10*3/uL (ref 0.7–4.0)
MCHC: 33.8 g/dL (ref 30.0–36.0)
MCV: 95.7 fl (ref 78.0–100.0)
Monocytes Absolute: 0.8 10*3/uL (ref 0.1–1.0)
Monocytes Relative: 12.1 % — ABNORMAL HIGH (ref 3.0–12.0)
Neutro Abs: 3.4 10*3/uL (ref 1.4–7.7)
Neutrophils Relative %: 54.1 % (ref 43.0–77.0)
Platelets: 190 10*3/uL (ref 150.0–400.0)
RBC: 4.29 Mil/uL (ref 4.22–5.81)
RDW: 13.2 % (ref 11.5–15.5)
WBC: 6.3 10*3/uL (ref 4.0–10.5)

## 2021-11-11 LAB — HEMOGLOBIN A1C: Hgb A1c MFr Bld: 7.3 % — ABNORMAL HIGH (ref 4.6–6.5)

## 2021-11-11 LAB — LIPID PANEL
Cholesterol: 163 mg/dL (ref 0–200)
HDL: 31.9 mg/dL — ABNORMAL LOW (ref >39.00)
LDL Cholesterol: 103 mg/dL — ABNORMAL HIGH (ref 0–99)
NonHDL: 131.3
Total CHOL/HDL Ratio: 5
Triglycerides: 140 mg/dL (ref 0.0–149.0)
VLDL: 28 mg/dL (ref 0.0–40.0)

## 2021-11-11 LAB — T4, FREE: Free T4: 0.83 ng/dL (ref 0.60–1.60)

## 2021-11-11 LAB — TSH: TSH: 0.81 u[IU]/mL (ref 0.35–5.50)

## 2021-11-11 NOTE — Telephone Encounter (Signed)
Pt said he has all 3 medications amLODipine (NORVASC) 5 MG tablet, chlorthalidone (HYGROTON) 25 MG tablet and lisinopril (ZESTRIL) 10 MG tablet but only taking 2 and he does not know which 2 he takes  will go home and call back with info

## 2021-11-11 NOTE — Patient Instructions (Signed)
You should expect a phone call from [facility] as a referral appears to have been authorized..  If you have not heard anything next week, call their office to set up an appointment.

## 2021-11-11 NOTE — Progress Notes (Signed)
Subjective:     Samuel Valencia is a 46 y.o. male and is here for f/u.  Pt states he has been monitoring bp at home, typically 140s/80s. Eating less meat and trying to eat more vegetables.  Patient intentionally losing weight.  Taking lisinopril 10 mg daily.  States BP elevated today as he had half a bag of pork rinds yesterday.  Patient states BP at home typically 140s over 80s.  Patient has appointment later today to pick up CPAP.  States referral was initially sent to a place that did not accept his insurance.  Patient has yet to hear regarding ENT referral.  Colonoscopy up-to-date done 10/07/2021.  Social History   Socioeconomic History   Marital status: Married    Spouse name: Not on file   Number of children: Not on file   Years of education: Not on file   Highest education level: 12th grade  Occupational History   Not on file  Tobacco Use   Smoking status: Never   Smokeless tobacco: Never  Vaping Use   Vaping Use: Never used  Substance and Sexual Activity   Alcohol use: No   Drug use: No   Sexual activity: Not on file  Other Topics Concern   Not on file  Social History Narrative   Caffiene soda 16 oz.    Working 2 FT ([employer])   Social Determinants of Radio broadcast assistant Strain: Medium Risk (10/11/2021)   Overall Financial Resource Strain (CARDIA)    Difficulty of Paying Living Expenses: Somewhat hard  Food Insecurity: No Food Insecurity (10/11/2021)   Hunger Vital Sign    Worried About Running Out of Food in the Last Year: Never true    [place] in the Last Year: Never true  Transportation Needs: No Transportation Needs (10/11/2021)   PRAPARE - Hydrologist (Medical): No    Lack of Transportation (Non-Medical): No  Physical Activity: Insufficiently Active (10/11/2021)   Exercise Vital Sign    Days of Exercise per Week: 4 days    Minutes of Exercise per Session: 10 min  Stress: Stress Concern Present (10/11/2021)   [place]    Feeling of Stress : To some extent  Social Connections: Moderately Integrated (10/11/2021)   Social Connection and Isolation Panel [NHANES]    Frequency of Communication with Friends and Family: More than three times a week    Frequency of Social Gatherings with Friends and Family: Never    Attends Religious Services: 1 to 4 times per year    Active Member of [employer] or Organizations: No    Attends Music therapist: Not on file    Marital Status: Married  Intimate Partner Violence: Not on file   Health Maintenance  Topic Date Due   INFLUENZA VACCINE  10/11/2021   COLONOSCOPY (Pts 45-26yr Insurance coverage will need to be confirmed)  10/07/2024   TETANUS/TDAP  05/10/2027   COVID-19 Vaccine  Completed   Hepatitis C Screening  Completed   HIV Screening  Completed   HPV VACCINES  Aged Out    The following portions of the patient's history were reviewed and updated as appropriate: allergies, current medications, past family history, past medical history, past social history, past surgical history, and problem list.  Review of Systems Pertinent items noted in HPI and remainder of comprehensive ROS otherwise negative.   Objective:    BP 132/86 (BP Location:  Right Arm, Cuff Size: Large)   Pulse 81   Temp 98.8 F (37.1 C) (Oral)   Ht 6' (1.829 m)   Wt 282 lb 9.6 oz (128.2 kg)   SpO2 97%   BMI 38.33 kg/m  General appearance: alert, cooperative, and no distress Head: Normocephalic, without obvious abnormality, atraumatic Eyes: conjunctivae/corneas clear. PERRL, EOM's intact. Fundi benign. Ears: normal TM's and external ear canals both ears Nose: Nares normal. Septum midline. Mucosa normal. No drainage or sinus tenderness. Throat: lips, mucosa, and tongue normal; teeth and gums normal Neck: no adenopathy, no carotid bruit, no JVD, supple, symmetrical, trachea midline, and thyroid not enlarged,  symmetric, no tenderness/mass/nodules Lungs: clear to auscultation bilaterally Heart: regular rate and rhythm, S1, S2 normal, no murmur, click, rub or gallop Abdomen: soft, non-tender; bowel sounds normal; no masses,  no organomegaly Extremities: extremities normal, atraumatic, no cyanosis or edema Pulses: 2+ and symmetric Skin: Skin color, texture, turgor normal. No rashes or lesions Lymph nodes: Cervical, supraclavicular, and axillary nodes normal. Neurologic: Alert and oriented X 3, normal strength and tone. Normal symmetric reflexes. Normal coordination and gait    Assessment:   Patient seen for follow-up on HTN.  Congratulated on lifestyle modifications.  Discussed rechecking lab including cholesterol and A1c as previously elevated .  Per chart review patient advised hemoglobin A1c was 6.3% on 4/26 via point-of-care testing but increased to 7.2% on 08/10/2021.  Patient wishes to recheck A1c this visit, if remains in diabetic range we will schedule follow-up in the next few weeks to discuss.  Continued lifestyle modifications encouraged.   Plan:   -immunizations discussed.  Patient declines influenza vaccine.  Essential hypertension -Continue lifestyle modifications and current medications including lisinopril 10 mg daily. -Was holding Norvasc 5 mg daily 2/2 prior hypotension.  For continued elevation consistently greater than 140/90 restart at half a tab (2.5 mg).  If unable to split tablet we will d/c Norvasc and increase lisinopril.  - Plan: CBC with Differential/Platelet, TSH, T4, Free, CMP  Pure hypertriglyceridemia  -Triglycerides 297 on 08/10/2021 - Plan: Lipid panel, CMP  Class 2 severe obesity due to excess calories with serious comorbidity and body mass index (BMI) of 38.0 to 38.9 in adult (HCC) -Body mass index is 38.33 kg/m.  - Plan: CBC with Differential/Platelet, TSH, T4, Free, Hemoglobin A1c, CMP  OSA (obstructive sleep apnea) -Patient to pick up CPAP today and start  using -Continue follow-up with pulmonology/sleep medicine  Prediabetes -Hemoglobin A1c 6.3% on 4/26 but increased to 7.2% on 08/10/2021. -Patient like to recheck lab to confirm DM diagnosis -Lifestyle modifications  Follow-up in the next 1-3 months based on lab results.  Grier Mitts, MD

## 2021-11-11 NOTE — Telephone Encounter (Signed)
Pt called back he is not taking amLODipine (NORVASC) 5 MG tablet nor potassium chloride SA (KLOR-CON M) 20 MEQ tablet .Pt is taking citalopram (CELEXA) 20 MG tablet  not regularly

## 2021-11-16 ENCOUNTER — Other Ambulatory Visit: Payer: Self-pay | Admitting: Family Medicine

## 2021-11-16 DIAGNOSIS — E876 Hypokalemia: Secondary | ICD-10-CM

## 2021-11-16 MED ORDER — POTASSIUM CHLORIDE CRYS ER 20 MEQ PO TBCR: EXTENDED_RELEASE_TABLET | ORAL | 0 refills | Status: DC

## 2021-12-03 ENCOUNTER — Other Ambulatory Visit: Payer: Self-pay | Admitting: Family Medicine

## 2021-12-29 ENCOUNTER — Ambulatory Visit: Payer: Commercial Managed Care - PPO | Admitting: Neurology

## 2022-01-10 ENCOUNTER — Telehealth: Payer: Self-pay | Admitting: Neurology

## 2022-01-10 NOTE — Progress Notes (Unsigned)
PATIENT: Samuel Valencia DOB: 03/20/1975  REASON FOR VISIT: follow up HISTORY FROM: patient PRIMARY NEUROLOGIST: Dr. Rexene Alberts  Chief Complaint  Patient presents with   Follow-up    Initial bipap user. Rm 9, alone. Feeling better with machine use.  Just getting use to not moving when asleep.      HISTORY OF PRESENT ILLNESS: Today 01/10/22:  [name] is a 46 year old male with a history of obstructive sleep apnea on BiPAP.  He returns today for follow-up.  He reports there is some gaps in his usage.  He has tried to put the machine on nightly.  Reports that he  Works 2 full time job. Reports that his sleep is fragmented due to that. Reports that he can tell a difference when he uses it. Reports that he has had to get adjusted to using the machine as he moves a lot in his sleep. Reports fatigue is better.  He states that several years ago he was diagnosed with a wart on his tonsils.  He is wondering if this plays any factor in his sleep apnea.  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  I have not seen any notes from ENT with details regarding his tonsils.    REVIEW OF SYSTEMS: Out of a complete 14 system review of symptoms, the patient complains only of the following symptoms, and all other reviewed systems are negative.  FSS ESS  ALLERGIES: No Known Allergies  HOME MEDICATIONS: Outpatient Medications Prior to Visit  Medication Sig Dispense Refill   amLODipine (NORVASC) 5 MG tablet Take 1 tablet by mouth daily.     chlorthalidone (HYGROTON) 25 MG tablet Take 25 mg by mouth daily.     citalopram (CELEXA) 20 MG tablet Take 20 mg by mouth daily.     lisinopril (ZESTRIL) 10 MG tablet Take 1 tablet (10 mg total) by mouth daily. 90 tablet 3   potassium chloride SA (KLOR-CON M) 20 MEQ tablet Take 1 tab twice a day for the next 4 days.  Then take one tab daily. 98 tablet 0   Facility-Administered Medications Prior to Visit  Medication Dose Route  Frequency Provider Last Rate Last Admin   0.9 %  sodium chloride infusion  500 mL Intravenous Once Sharyn Creamer, MD        PAST MEDICAL HISTORY: Past Medical History:  Diagnosis Date   Hypertension    Prediabetes     PAST SURGICAL HISTORY: Past Surgical History:  Procedure Laterality Date   VASECTOMY      FAMILY HISTORY: Family History  Problem Relation Age of Onset   Hypertension Mother    Hypertension Father    Hyperlipidemia Father    Diabetes Father    Colon polyps Neg Hx    Colon cancer Neg Hx    Esophageal cancer Neg Hx    Stomach cancer Neg Hx    Rectal cancer Neg Hx     SOCIAL HISTORY: Social History   Socioeconomic History   Marital status: Married    Spouse name: Not on file   Number of children: Not on file   Years of education: Not on file   Highest education level: 12th grade  Occupational History   Not on file  Tobacco Use   Smoking status: Never   Smokeless tobacco: Never  Vaping Use   Vaping Use: Never used  Substance and Sexual Activity   Alcohol use: No   Drug use: No   Sexual  activity: Not on file  Other Topics Concern   Not on file  Social History Narrative   Caffiene soda 16 oz.    Working 2 FT ([employer])   Social Determinants of Radio broadcast assistant Strain: Medium Risk (10/11/2021)   Overall Financial Resource Strain (CARDIA)    Difficulty of Paying Living Expenses: Somewhat hard  Food Insecurity: No Food Insecurity (10/11/2021)   Hunger Vital Sign    Worried About Running Out of Food in the Last Year: Never true    [place] in the Last Year: Never true  Transportation Needs: No Transportation Needs (10/11/2021)   PRAPARE - Hydrologist (Medical): No    Lack of Transportation (Non-Medical): No  Physical Activity: Insufficiently Active (10/11/2021)   Exercise Vital Sign    Days of Exercise per Week: 4 days    Minutes of Exercise per Session: 10 min  Stress: Stress Concern Present  (10/11/2021)   [place]    Feeling of Stress : To some extent  Social Connections: Moderately Integrated (10/11/2021)   Social Connection and Isolation Panel [NHANES]    Frequency of Communication with Friends and Family: More than three times a week    Frequency of Social Gatherings with Friends and Family: Never    Attends Religious Services: 1 to 4 times per year    Active Member of [employer] or Organizations: No    Attends Music therapist: Not on file    Marital Status: Married  Human resources officer Violence: Not on file      PHYSICAL EXAM  Vitals:   01/11/22 1059  BP: 135/84  Pulse: 77  Weight: 286 lb 3.2 oz (129.8 kg)  Height: '5\' 11"'$  (1.803 m)   Body mass index is 39.92 kg/m.  Generalized: Well developed, in no acute distress  Chest: Lungs clear to auscultation bilaterally  Neurological examination  Mentation: Alert oriented to time, place, history taking. Follows all commands speech and language fluent Cranial nerve II-XII: Extraocular movements were full, visual field were full on confrontational test Head turning and shoulder shrug  were normal and symmetric. Gait and station: Gait is normal.    DIAGNOSTIC DATA (LABS, IMAGING, TESTING) - I reviewed patient records, labs, notes, testing and imaging myself where available.  Lab Results  Component Value Date   WBC 6.3 11/11/2021   HGB 13.9 11/11/2021   HCT 41.0 11/11/2021   MCV 95.7 11/11/2021   PLT 190.0 11/11/2021      Component Value Date/Time   NA 140 11/11/2021 1043   K 3.0 (L) 11/11/2021 1043   CL 99 11/11/2021 1043   CO2 34 (H) 11/11/2021 1043   GLUCOSE 112 (H) 11/11/2021 1043   BUN 13 11/11/2021 1043   CREATININE 1.03 11/11/2021 1043   CALCIUM 9.1 11/11/2021 1043   PROT 8.1 11/11/2021 1043   ALBUMIN 4.3 11/11/2021 1043   AST 19 11/11/2021 1043   ALT 21 11/11/2021 1043   ALKPHOS 41 11/11/2021 1043   BILITOT 1.0 11/11/2021  1043   GFRNONAA 103 11/02/2006 0839   GFRAA 125 11/02/2006 0839   Lab Results  Component Value Date   CHOL 163 11/11/2021   HDL 31.90 (L) 11/11/2021   LDLCALC 103 (H) 11/11/2021   LDLDIRECT 75.0 08/10/2021   TRIG 140.0 11/11/2021   CHOLHDL 5 11/11/2021   Lab Results  Component Value Date   HGBA1C 7.3 (H) 11/11/2021   No results  found for: "VITAMINB12" Lab Results  Component Value Date   TSH 0.81 11/11/2021      ASSESSMENT AND PLAN 46 y.o. year old male  has a past medical history of Hypertension and Prediabetes. here with:  OSA on CPAP  - CPAP compliance suboptimal  - Good treatment of AHI  - Encourage patient to use CPAP nightly and > 4 hours each night - F/U in 6 months  or sooner if needed   Ward Givens, MSN, NP-C 01/10/2022, 3:36 PM [facility] [address], [place], [place] [address] [phone]

## 2022-01-10 NOTE — Telephone Encounter (Signed)
LVM and sent mychart msg informing pt of need to reschedule 11/1 appointment - MD out

## 2022-01-11 ENCOUNTER — Ambulatory Visit: Payer: Commercial Managed Care - PPO | Admitting: Adult Health

## 2022-01-11 ENCOUNTER — Encounter: Payer: Self-pay | Admitting: Adult Health

## 2022-01-11 ENCOUNTER — Ambulatory Visit: Payer: Commercial Managed Care - PPO | Admitting: Neurology

## 2022-01-11 VITALS — BP 135/84 | HR 77 | Ht 71.0 in | Wt 286.2 lb

## 2022-01-11 DIAGNOSIS — G4733 Obstructive sleep apnea (adult) (pediatric): Secondary | ICD-10-CM

## 2022-01-11 NOTE — Patient Instructions (Signed)
Continue using BiPAP nightly and greater than 4 hours each night If your symptoms worsen or you develop new symptoms please let us know.    

## 2022-01-12 ENCOUNTER — Ambulatory Visit (INDEPENDENT_AMBULATORY_CARE_PROVIDER_SITE_OTHER): Payer: Self-pay | Admitting: Family Medicine

## 2022-01-12 VITALS — BP 128/82 | HR 101 | Temp 98.7°F | Wt 289.0 lb

## 2022-01-12 DIAGNOSIS — I10 Essential (primary) hypertension: Secondary | ICD-10-CM

## 2022-01-12 MED ORDER — AMLODIPINE BESYLATE 2.5 MG PO TABS: 2.5000 mg | ORAL_TABLET | Freq: Every day | ORAL | 3 refills | Status: DC

## 2022-01-12 NOTE — Progress Notes (Signed)
Subjective:    Patient ID: Samuel Valencia, male    DOB: December 11, 1975, 46 y.o.   MRN: [id number]  Chief Complaint  Patient presents with   Follow-up    Was good yday and it was good.     HPI Patient was seen today for follow-up on HTN.  Patient states BP has been great.  Denies headaches, CP, changes in vision.  Patient committed to lifestyle changes as would like to be healthy overall.  Patient states he completed sleep study and is using BiPAP.  Patient thinks he is not getting enough sleep as he works 2 full-time jobs and may only sleep for 5 hours.  Patient considering letting go of one of the jobs.  Past Medical History:  Diagnosis Date   Hypertension    Prediabetes     No Known Allergies  ROS General: Denies fever, chills, night sweats, changes in weight, changes in appetite HEENT: Denies headaches, ear pain, changes in vision, rhinorrhea, sore throat CV: Denies CP, palpitations, SOB, orthopnea Pulm: Denies SOB, cough, wheezing GI: Denies abdominal pain, nausea, vomiting, diarrhea, constipation GU: Denies dysuria, hematuria, frequency Msk: Denies muscle cramps, joint pains Neuro: Denies weakness, numbness, tingling Skin: Denies rashes, bruising Psych: Denies depression, anxiety, hallucinations      Objective:    Blood pressure 128/82, pulse (!) 101, temperature 98.7 F (37.1 C), temperature source Oral, weight 289 lb (131.1 kg), SpO2 93 %.   Gen. Pleasant, well-nourished, in no distress, normal affect   HEENT: [place]/AT, face symmetric, conjunctiva clear, no scleral icterus, PERRLA, EOMI, nares patent without drainage, pharynx without erythema or exudate.  Throat clearing noted during exam Lungs: no accessory muscle use, CTAB, no wheezes or rales Cardiovascular: RRR, no m/r/g, no peripheral edema Neuro:  A&Ox3, CN II-XII intact, normal gait Skin:  Warm, no lesions/ rash   Wt Readings from Last 3 Encounters:  01/12/22 289 lb (131.1 kg)  01/11/22 286 lb 3.2 oz (129.8  kg)  11/11/21 282 lb 9.6 oz (128.2 kg)    Lab Results  Component Value Date   WBC 6.3 11/11/2021   HGB 13.9 11/11/2021   HCT 41.0 11/11/2021   PLT 190.0 11/11/2021   GLUCOSE 112 (H) 11/11/2021   CHOL 163 11/11/2021   TRIG 140.0 11/11/2021   HDL 31.90 (L) 11/11/2021   LDLDIRECT 75.0 08/10/2021   LDLCALC 103 (H) 11/11/2021   ALT 21 11/11/2021   AST 19 11/11/2021   NA 140 11/11/2021   K 3.0 (L) 11/11/2021   CL 99 11/11/2021   CREATININE 1.03 11/11/2021   BUN 13 11/11/2021   CO2 34 (H) 11/11/2021   TSH 0.81 11/11/2021   PSA 0.51 08/10/2021   HGBA1C 7.3 (H) 11/11/2021    Assessment/Plan:  Essential hypertension -Controlled, however continued clearing of throat noted during exam likely from use of lisinopril. -We d/c lisinopril 10 mg daily due to tickle in throat -Start Norvasc 2.5 mg daily.  Increase to 5 mg daily if needed -Continue lifestyle modifications  - Plan: amLODipine (NORVASC) 2.5 MG tablet  F/u in the next few months, sooner if needed  Grier Mitts, MD

## 2022-01-12 NOTE — Patient Instructions (Signed)
Keep up the good work.   Stop taking Lisinopril as it is causing you to clear your throat.  The prescription for norvasc 2.5 mg was sent to your pharmacy.  Start taking this instead.

## 2022-01-16 ENCOUNTER — Ambulatory Visit (INDEPENDENT_AMBULATORY_CARE_PROVIDER_SITE_OTHER): Payer: Commercial Managed Care - PPO | Admitting: Family Medicine

## 2022-01-16 ENCOUNTER — Ambulatory Visit (INDEPENDENT_AMBULATORY_CARE_PROVIDER_SITE_OTHER): Payer: Commercial Managed Care - PPO

## 2022-01-16 ENCOUNTER — Encounter: Payer: Self-pay | Admitting: Family Medicine

## 2022-01-16 VITALS — BP 146/84 | HR 96 | Temp 98.5°F | Wt 288.8 lb

## 2022-01-16 DIAGNOSIS — M2142 Flat foot [pes planus] (acquired), left foot: Secondary | ICD-10-CM

## 2022-01-16 DIAGNOSIS — M542 Cervicalgia: Secondary | ICD-10-CM

## 2022-01-16 DIAGNOSIS — M79671 Pain in right foot: Secondary | ICD-10-CM

## 2022-01-16 DIAGNOSIS — M79672 Pain in left foot: Secondary | ICD-10-CM | POA: Diagnosis not present

## 2022-01-16 DIAGNOSIS — M2141 Flat foot [pes planus] (acquired), right foot: Secondary | ICD-10-CM | POA: Diagnosis not present

## 2022-01-16 DIAGNOSIS — M545 Low back pain, unspecified: Secondary | ICD-10-CM

## 2022-01-16 DIAGNOSIS — T148XXA Other injury of unspecified body region, initial encounter: Secondary | ICD-10-CM

## 2022-01-16 MED ORDER — CYCLOBENZAPRINE HCL 10 MG PO TABS: 10.0000 mg | ORAL_TABLET | Freq: Every evening | ORAL | 0 refills | Status: DC | PRN
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
## 2022-01-16 NOTE — Patient Instructions (Signed)
You can take over-the-counter Tylenol for current symptoms.  Be careful using Aleve, ibuprofen, other NSAIDs as it may increase your blood pressure.  A prescription for Flexeril, a muscle relaxer was sent to your pharmacy.  Try using this at night as it can cause drowsiness.  We will obtain x-rays this visit.  Further recommendations based on imaging.  You are likely to be sore for the next several weeks.  You can use heat, topical analgesics like Aspercreme, Biofreeze, IcyHot as needed

## 2022-01-17 ENCOUNTER — Telehealth: Payer: Self-pay | Admitting: Family Medicine
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
## 2022-01-19 ENCOUNTER — Encounter: Payer: Self-pay | Admitting: Family Medicine

## 2022-01-19 NOTE — Telephone Encounter (Signed)
Note sent to mychart per pt request.

## 2022-01-19 NOTE — Telephone Encounter (Signed)
Okay for note

## 2022-01-20 ENCOUNTER — Telehealth: Payer: Self-pay | Admitting: Family Medicine
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
## 2022-01-23 ENCOUNTER — Encounter: Payer: Self-pay | Admitting: Family Medicine

## 2022-01-23 ENCOUNTER — Ambulatory Visit (INDEPENDENT_AMBULATORY_CARE_PROVIDER_SITE_OTHER): Payer: Commercial Managed Care - PPO

## 2022-01-23 ENCOUNTER — Ambulatory Visit (INDEPENDENT_AMBULATORY_CARE_PROVIDER_SITE_OTHER): Payer: Commercial Managed Care - PPO | Admitting: Family Medicine

## 2022-01-23 VITALS — BP 150/90 | HR 84 | Temp 98.7°F | Wt 288.4 lb

## 2022-01-23 DIAGNOSIS — L600 Ingrowing nail: Secondary | ICD-10-CM

## 2022-01-23 DIAGNOSIS — M62830 Muscle spasm of back: Secondary | ICD-10-CM | POA: Diagnosis not present

## 2022-01-23 DIAGNOSIS — M542 Cervicalgia: Secondary | ICD-10-CM

## 2022-01-23 DIAGNOSIS — M79671 Pain in right foot: Secondary | ICD-10-CM

## 2022-01-23 DIAGNOSIS — M545 Low back pain, unspecified: Secondary | ICD-10-CM

## 2022-01-23 DIAGNOSIS — I10 Essential (primary) hypertension: Secondary | ICD-10-CM

## 2022-01-23 DIAGNOSIS — M79672 Pain in left foot: Secondary | ICD-10-CM

## 2022-01-23 MED ORDER — TRAMADOL HCL 50 MG PO TABS: 50.0000 mg | ORAL_TABLET | Freq: Three times a day (TID) | ORAL | 0 refills | Status: AC | PRN

## 2022-01-23 NOTE — Telephone Encounter (Signed)
Pt seen in office today, referral placed.
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
## 2022-01-23 NOTE — Patient Instructions (Addendum)
Referral was placed for you to see the podiatrist.  You should expect a phone call about scheduling this appointment  A prescription for tramadol, medication for pain was sent to your pharmacy.  You can take this as needed for the next few days.  If the medication is helping new prescription can be sent to the pharmacy.  A note for work was also printed.

## 2022-01-30 ENCOUNTER — Ambulatory Visit (INDEPENDENT_AMBULATORY_CARE_PROVIDER_SITE_OTHER): Payer: Commercial Managed Care - PPO

## 2022-01-30 ENCOUNTER — Encounter: Payer: Self-pay | Admitting: Podiatry

## 2022-01-30 ENCOUNTER — Ambulatory Visit (INDEPENDENT_AMBULATORY_CARE_PROVIDER_SITE_OTHER): Payer: Commercial Managed Care - PPO | Admitting: Podiatry

## 2022-01-30 DIAGNOSIS — M79672 Pain in left foot: Secondary | ICD-10-CM

## 2022-01-30 DIAGNOSIS — M79671 Pain in right foot: Secondary | ICD-10-CM

## 2022-01-30 DIAGNOSIS — L600 Ingrowing nail: Secondary | ICD-10-CM

## 2022-01-30 MED ORDER — DICLOFENAC SODIUM 75 MG PO TBEC: 75.0000 mg | DELAYED_RELEASE_TABLET | Freq: Two times a day (BID) | ORAL | 2 refills | Status: DC

## 2022-01-30 NOTE — Patient Instructions (Signed)

## 2022-02-01 ENCOUNTER — Telehealth: Payer: Self-pay | Admitting: *Deleted

## 2022-02-01 ENCOUNTER — Encounter: Payer: Self-pay | Admitting: *Deleted

## 2022-02-01 NOTE — Telephone Encounter (Signed)
Patient is calling to request an out of work note until his upcoming appointment, is having a difficult time putting pressure on foot after toenail was treated and one removed, has been following soaking instructions but still very sensitive.

## 2022-02-01 NOTE — Telephone Encounter (Signed)
fine

## 2022-02-01 NOTE — Telephone Encounter (Signed)
Work note has been written,waiting for pick up at front desk by patient,patient aware.

## 2022-02-01 NOTE — Telephone Encounter (Signed)
Note, thanks.
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
## 2022-02-20 ENCOUNTER — Encounter: Payer: Self-pay | Admitting: Podiatry

## 2022-02-20 ENCOUNTER — Ambulatory Visit (INDEPENDENT_AMBULATORY_CARE_PROVIDER_SITE_OTHER): Payer: Commercial Managed Care - PPO

## 2022-02-20 ENCOUNTER — Ambulatory Visit (INDEPENDENT_AMBULATORY_CARE_PROVIDER_SITE_OTHER): Payer: Commercial Managed Care - PPO | Admitting: Podiatry

## 2022-02-20 DIAGNOSIS — M79672 Pain in left foot: Secondary | ICD-10-CM

## 2022-02-20 DIAGNOSIS — M79671 Pain in right foot: Secondary | ICD-10-CM
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
## 2022-03-10 ENCOUNTER — Other Ambulatory Visit: Payer: Self-pay | Admitting: Family Medicine

## 2022-03-10 DIAGNOSIS — I10 Essential (primary) hypertension: Secondary | ICD-10-CM

## 2022-03-15 ENCOUNTER — Ambulatory Visit: Payer: Commercial Managed Care - PPO | Admitting: Family Medicine

## 2022-03-24 ENCOUNTER — Ambulatory Visit: Payer: Commercial Managed Care - PPO | Admitting: Family Medicine

## 2022-03-24 VITALS — BP 142/98 | HR 83 | Temp 99.0°F | Wt 291.0 lb

## 2022-03-24 DIAGNOSIS — M79671 Pain in right foot: Secondary | ICD-10-CM

## 2022-03-24 DIAGNOSIS — M79672 Pain in left foot: Secondary | ICD-10-CM

## 2022-03-24 DIAGNOSIS — I10 Essential (primary) hypertension: Secondary | ICD-10-CM

## 2022-03-24 DIAGNOSIS — E118 Type 2 diabetes mellitus with unspecified complications: Secondary | ICD-10-CM | POA: Diagnosis not present

## 2022-03-24 DIAGNOSIS — R202 Paresthesia of skin: Secondary | ICD-10-CM

## 2022-03-24 LAB — POCT GLYCOSYLATED HEMOGLOBIN (HGB A1C): Hemoglobin A1C: 7.1 % — AB (ref 4.0–5.6)

## 2022-03-24 MED ORDER — AMLODIPINE BESYLATE 5 MG PO TABS: 5.0000 mg | ORAL_TABLET | Freq: Every day | ORAL | 1 refills | Status: DC

## 2022-03-24 MED ORDER — METFORMIN HCL 500 MG PO TABS: 500.0000 mg | ORAL_TABLET | Freq: Every day | ORAL | 1 refills | Status: DC
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
## 2022-03-24 NOTE — Patient Instructions (Signed)
Your hemoglobin A1C was 7.1% this visit.  Prescription for metformin 500 mg once daily sent to pharmacy.  Continue working on diet and exercise.

## 2022-04-03 ENCOUNTER — Encounter: Payer: Self-pay | Admitting: Family Medicine

## 2022-05-15 ENCOUNTER — Institutional Professional Consult (permissible substitution): Payer: Commercial Managed Care - PPO | Admitting: Neurology

## 2022-05-15 ENCOUNTER — Telehealth: Payer: Self-pay | Admitting: *Deleted

## 2022-05-15 DIAGNOSIS — Z0289 Encounter for other administrative examinations: Secondary | ICD-10-CM
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
## 2022-06-05 ENCOUNTER — Encounter: Payer: Self-pay | Admitting: Neurology

## 2022-06-05 ENCOUNTER — Ambulatory Visit: Payer: BC Managed Care – PPO | Admitting: Neurology

## 2022-06-05 VITALS — BP 155/82 | HR 84 | Ht 71.0 in | Wt 282.0 lb

## 2022-06-05 DIAGNOSIS — E669 Obesity, unspecified: Secondary | ICD-10-CM

## 2022-06-05 DIAGNOSIS — G4731 Primary central sleep apnea: Secondary | ICD-10-CM | POA: Diagnosis not present

## 2022-06-05 DIAGNOSIS — R7309 Other abnormal glucose: Secondary | ICD-10-CM

## 2022-06-05 DIAGNOSIS — G4739 Other sleep apnea: Secondary | ICD-10-CM

## 2022-06-05 DIAGNOSIS — R030 Elevated blood-pressure reading, without diagnosis of hypertension: Secondary | ICD-10-CM | POA: Diagnosis not present

## 2022-06-05 DIAGNOSIS — R202 Paresthesia of skin: Secondary | ICD-10-CM

## 2022-06-05 DIAGNOSIS — G4733 Obstructive sleep apnea (adult) (pediatric): Secondary | ICD-10-CM

## 2022-06-05 NOTE — Patient Instructions (Addendum)
Your exam looks reassuring, no muscle wasting or weakness, no numbness.  We will do an EMG and nerve conduction velocity test, which is an electrical nerve and muscle test, which we will schedule. We will call you with the results.  There is no specific treatment for most cases of tingling. The most common cause for neuropathy is diabetes in this [place], in which case, tight glucose control is key.  Some studies suggest that obesity and prediabetes can also cause nerve damage even in the absence of a formal diagnosis of diabetes.  Please work on weight loss and be consistent with your BP medication and BiPAP machine.   Please follow up with Dr. Volanda Napoleon for blood sugar and blood pressure management.   Please continue using your BiPAP regularly. While your insurance requires that you use BiPAP at least 4 hours each night on 70% of the nights, I recommend, that you not skip any nights and use it throughout the night if you can. Getting used to BiPAP and staying with the treatment long term does take time and patience and discipline. Untreated obstructive sleep apnea when it is moderate to severe can have an adverse impact on cardiovascular health and raise her risk for heart disease, arrhythmias, hypertension, congestive heart failure, stroke and diabetes. Untreated obstructive sleep apnea causes sleep disruption, nonrestorative sleep, and sleep deprivation. This can have an impact on your day to day functioning and cause daytime sleepiness and impairment of cognitive function, memory loss, mood disturbance, and problems focussing. Using BiPAP regularly can improve these symptoms.   We will check blood work today and call you with the test results.  We will do an EMG and nerve conduction velocity test, which is an electrical nerve and muscle test, which we will schedule. We will call you with the results.

## 2022-06-05 NOTE — Progress Notes (Signed)
Subjective:    Patient ID: Samuel Valencia is a 47 y.o. male.  HPI    Interim history:   Samuel Valencia is a 47 year old right-handed gentleman with an underlying medical history of prediabetes, uncontrolled hypertension, reflux disease, and severe obesity with a BMI of over 40, who presents for a new problem visit of paresthesias.  He is referred by his primary care physician, Dr. Volanda Napoleon. The patient is accompanied by his wife and son today. He missed an appointment on 05/15/2022. He saw Dr. Volanda Napoleon on 03/24/2022, and I reviewed the note.  I first met him at the request of his primary care physician on 08/31/2021, at which time he reported snoring and excessive daytime somnolence.  He had a split-night sleep study on 09/26/2021 and was placed on BiPAP therapy.  He had a subsequent appointment with Samuel Browner, Samuel Valencia on 01/11/2022, at which time he had good apnea control with BiPAP of 20/16, but he was not fully compliant with treatment.  He did report that his fatigue was better.    Today, 06/05/2022: He reports a approximately 1 year history of intermittent tingling affecting both upper extremities and lower extremities.  Symptoms come and go, he has no numbness, no weakness, he has not fallen because of his tingling.  He describes a tingling sensation going up from the wrist to the forearms and in the feet.  He does not have radiating neck pain or sciatica type symptoms.  No significant low back pain or current neck pain, he had neck pain some months ago but this resolved.  He takes metformin once a day 500 mg strength, has not been on it for long, maybe 3 months.  His A1c was elevated on 03/24/2022 at 7.1, prior to that was 7.3, 7.2, and about a year ago it was 6.3.  He hydrates well with water, estimates that he drinks about 4 bottles of water per day, 16.9 ounce size.  He drinks caffeine in the form of soda, 1 bottle per day, no coffee.  He admits to not being fully compliant with his BiPAP machine.  He  reports that his mask leaks, he has not talked to his DME provider about a different mask.  I reviewed his compliance data from 05/06/2022 through 06/04/2022, during which time he used his BiPAP 22 out of 30 days with percent use days greater than 4 hours at only 13%, indicating significantly suboptimal compliance.  Average usage of 2 hours and 35 minutes on days on treatment, residual AHI elevated at 10.2/h, in the mild range, no significant central events, about 2/h.  Leak on the high side with the 95th percentile at 64.8 L/min.  He admits that he did not take his blood pressure medication today, his blood pressure medicine is amlodipine and he admits that he does not always take it consistently.  His blood pressure was elevated today, and we rechecked it.   He has seen a podiatrist. He had a toe nail removed.   He had a cervical spine CT without contrast on 07/03/2021 and I reviewed the results: Impression: Straightening of the cervical spine.  No acute osseous abnormality.    He had a cervical spine x-ray on 01/23/2022 and I reviewed the results: Impression: Question muscle spasm; otherwise negative exam.   The patient's allergies, current medications, family history, past medical history, past social history, past surgical history and problem list were reviewed and updated as appropriate.   Previously:   08/31/21: (He) reports snoring and excessive  daytime somnolence.  I reviewed the office note from 07/06/2021.  His blood pressure was 165/104 at the time.  He was advised to start chlorthalidone.  He was on amlodipine and Zestoretic at the time.  His Epworth sleepiness score is 9 out of 24, fatigue severity score is 48 out of 63.  He has nocturnal reflux symptoms.  He has on a rare occasion woken up with a sense of gasping for air.  He has a very limited sleep schedule as he works 2 full-time jobs in [place].  He works in different shifts as well.  He works as a first shift position with 10 days on and  4 days off and he works third shift position for his other job.  Bedtime is highly variable but may be around 7 PM and he may wake up around 11 PM.  He may get about 4 hours of sleep on any given typical day.  His sleep is also interrupted by significant nocturia, about 4 times per night.  He has morning headaches which are milder, occasional.  He has no family history of sleep apnea.  He is a non-smoker and does not currently drink any alcohol.  He drinks caffeine in the form of soda, about 16 ounce water per day.    His Past Medical History Is Significant For: Past Medical History:  Diagnosis Date   Diabetes ([place])    Hypertension    Prediabetes     His Past Surgical History Is Significant For: Past Surgical History:  Procedure Laterality Date   VASECTOMY      His Family History Is Significant For: Family History  Problem Relation Age of Onset   Hypertension Mother    Hypertension Father    Hyperlipidemia Father    Diabetes Father    Colon polyps Neg Hx    Colon cancer Neg Hx    Esophageal cancer Neg Hx    Stomach cancer Neg Hx    Rectal cancer Neg Hx    Neuropathy Neg Hx    Headache Neg Hx     His Social History Is Significant For: Social History   Socioeconomic History   Marital status: Married    Spouse name: Not on file   Number of children: Not on file   Years of education: Not on file   Highest education level: 12th grade  Occupational History   Not on file  Tobacco Use   Smoking status: Never   Smokeless tobacco: Never  Vaping Use   Vaping Use: Never used  Substance and Sexual Activity   Alcohol use: No   Drug use: No   Sexual activity: Not on file  Other Topics Concern   Not on file  Social History Narrative   Caffiene soda 16 oz.    Working 2 FT ([place])   Social Determinants of Radio broadcast assistant Strain: Medium Risk (10/11/2021)   Overall Financial Resource Strain (CARDIA)    Difficulty of Paying Living Expenses: Somewhat hard  Food  Insecurity: No Food Insecurity (10/11/2021)   Hunger Vital Sign    Worried About Running Out of Food in the Last Year: Never true    [place] in the Last Year: Never true  Transportation Needs: No Transportation Needs (10/11/2021)   PRAPARE - Hydrologist (Medical): No    Lack of Transportation (Non-Medical): No  Physical Activity: Insufficiently Active (10/11/2021)   Exercise Vital Sign    Days of Exercise  per Week: 4 days    Minutes of Exercise per Session: 10 min  Stress: Stress Concern Present (10/11/2021)   [place]    Feeling of Stress : To some extent  Social Connections: Moderately Integrated (10/11/2021)   Social Connection and Isolation Panel [NHANES]    Frequency of Communication with Friends and Family: More than three times a week    Frequency of Social Gatherings with Friends and Family: Never    Attends Religious Services: 1 to 4 times per year    Active Member of [employer] or Organizations: No    Attends Music therapist: Not on file    Marital Status: Married    His Allergies Are:  No Known Allergies:   His Current Medications Are:  Outpatient Encounter Medications as of 06/05/2022  Medication Sig   amLODipine (NORVASC) 5 MG tablet Take 1 tablet (5 mg total) by mouth daily.   citalopram (CELEXA) 20 MG tablet Take 20 mg by mouth daily.   diclofenac (VOLTAREN) 75 MG EC tablet Take 1 tablet (75 mg total) by mouth 2 (two) times daily.   metFORMIN (GLUCOPHAGE) 500 MG tablet Take 1 tablet (500 mg total) by mouth daily with breakfast.   cyclobenzaprine (FLEXERIL) 10 MG tablet Take 1 tablet (10 mg total) by mouth at bedtime as needed for muscle spasms.   potassium chloride SA (KLOR-CON M) 20 MEQ tablet Take 1 tab twice a day for the next 4 days.  Then take one tab daily. (Patient taking differently: 20 mEq daily. Take 1 tab twice a day for the next 4 days.  Then take one  tab daily.)   Facility-Administered Encounter Medications as of 06/05/2022  Medication   0.9 %  sodium chloride infusion  :  Review of Systems:  Out of a complete 14 point review of systems, all are reviewed and negative with the exception of these symptoms as listed below:   Review of Systems  Neurological:        Pt here for Neuropathy pain Pt sates discomfort in feet Pt states was having shooting pain going up is arch . Pt states tingling in both arms and hands Pt states mild headaches often .    Objective:  Neurological Exam  Physical Exam Physical Examination:   Vitals:   06/05/22 1230 06/05/22 1236  BP: (!) 150/96 (!) 155/82  Pulse: 86 84    General Examination: The patient is a very pleasant 47 y.o. male in no acute distress. He appears well-developed and well-nourished and well groomed.   HEENT: Normocephalic, atraumatic, pupils are equal, round and reactive to light, extraocular tracking is good without limitation to gaze excursion or nystagmus noted. Hearing is grossly intact. Face is symmetric with normal facial animation. Speech is clear with no dysarthria noted. There is no hypophonia. There is no lip, neck/head, jaw or voice tremor. Neck is supple with full range of passive and active motion. There are no carotid bruits on auscultation. Oropharynx exam reveals: mild to moderate mouth dryness, adequate dental hygiene and marked airway crowding.  Tongue protrudes centrally and palate elevates symmetrically.   Chest: Clear to auscultation without wheezing, rhonchi or crackles noted.   Heart: S1+S2+0, regular and normal without murmurs, rubs or gallops noted.    Abdomen: Soft, non-tender and non-distended.   Extremities: There is no pitting edema in the distal lower extremities bilaterally.    Skin: Warm and dry without trophic changes noted.  Dry skin  distal lower extremities, he reports that he took a shower and did not moisturize.    Musculoskeletal: exam reveals  no obvious joint deformities.    Neurologically:  Mental status: The patient is awake, alert and oriented in all 4 spheres. His immediate and remote memory, attention, language skills and fund of knowledge are appropriate. There is no evidence of aphasia, agnosia, apraxia or anomia. Speech is clear with normal prosody and enunciation. Thought process is linear. Mood is normal and affect is normal.  Cranial nerves II - XII are as described above under HEENT exam.  Motor exam: Normal bulk, strength and tone is noted. There is no obvious tremor.  No muscle wasting, no obvious fasciculations in the forearms and distal lower extremities below the knees bilaterally.  Reflexes are 1+ in the upper extremities and trace in the lower extremities.  Toes are downgoing bilaterally.  Normal fine motor skills in the hands and normal foot taps bilaterally in the lower extremities.  Cerebellar testing: No dysmetria or intention tremor. There is no truncal or gait ataxia.  Normal heel-to-shin, normal finger-to-nose bilaterally. Sensory exam: intact to light touch, pinprick, vibration and temperature sense in the distal upper and lower extremities, including hands and forearms bilaterally, feet and lower extremities below the knees bilaterally.  Gait, station and balance: He stands easily. No veering to one side is noted. No leaning to one side is noted. Posture is age-appropriate and stance is narrow based. Gait shows normal stride length and normal pace. No problems turning are noted.    Assessment and Plan:  In summary, Samuel Valencia is a very pleasant 47 year old male with an underlying medical history of diabetes, uncontrolled hypertension (not fully compliant with BP medication), reflux disease, and obesity with a BMI of over 40, as well is severe complex sleep apnea (not fully compliant with BiPAP therapy), who presents for evaluation of his paresthesias affecting both upper and lower extremities distally,  without any numbness reported, symptoms have been ongoing for months.  Exam findings are benign, in particular, no evidence of muscle wasting, no weakness, he has symmetrical reflexes and no deficits on sensory testing.  He is largely reassured today but we talked about signs and symptoms and causes of neuropathy.  Most typically, chronic prediabetes, diabetes and even obesity are common causes of neuropathy.  He is advised to continue to stay well-hydrated, work on weight loss, and better diabetes control. Of note, his A1c was above 7 in January 2024.  He is advised to follow-up with his primary care regarding blood pressure and diabetes control.  He is reminded to be compliant with his blood pressure medication, his BP was elevated today and he reports that he did not take his amlodipine as he overslept.  He is furthermore reminded to be consistent with his BiPAP machine for his sleep apnea.  He was advised that treating his sleep apnea may contribute to improvement in headaches and elevated blood pressure values as well.  I would like to proceed with blood work today to look for any treatable causes of paresthesias, he has an appointment with his primary care in April and she will likely recheck his A1c at the time.  We will check inflammatory markers, autoimmune markers, B vitamins and protein breakdown today, we can call him with the results for now.  He is advised that we can cancel next month's appointment which is for sleep apnea recheck, we talked about his sleep apnea and management today as well.  He is advised to make an appointment with his DME provider for a mask refit, he may benefit from a different style of mask as the leak is high from the mask currently.  I would also like to proceed with EMG and nerve conduction velocity testing to look for any objective signs of nerve damage/neuropathy.  We will schedule this through our office and also call him with the results.  I answered all their questions  today and the patient and his wife are in agreement with our plan.  For now, we will keep him posted as to his test results by phone call, he is advised to follow-up in sleep clinic in about 6 months to see one of our nurse practitioners.   I spent 40 minutes in total face-to-face time and in reviewing records during pre-charting, more than 50% of which was spent in counseling and coordination of care, reviewing test results, reviewing medications and treatment regimen and/or in discussing or reviewing the diagnosis of paresthesias, complex sleep apnea, the prognosis and treatment options. Pertinent laboratory and imaging test results that were available during this visit with the patient were reviewed by me and considered in my medical decision making (see chart for details).

## 2022-06-08 DIAGNOSIS — G4733 Obstructive sleep apnea (adult) (pediatric): Secondary | ICD-10-CM | POA: Diagnosis not present

## 2022-06-10 LAB — MULTIPLE MYELOMA PANEL, SERUM
Albumin SerPl Elph-Mcnc: 4 g/dL (ref 2.9–4.4)
Albumin/Glob SerPl: 1.2 (ref 0.7–1.7)
Alpha 1: 0.2 g/dL (ref 0.0–0.4)
Alpha2 Glob SerPl Elph-Mcnc: 0.5 g/dL (ref 0.4–1.0)
B-Globulin SerPl Elph-Mcnc: 1.1 g/dL (ref 0.7–1.3)
Gamma Glob SerPl Elph-Mcnc: 1.8 g/dL (ref 0.4–1.8)
Globulin, Total: 3.6 g/dL (ref 2.2–3.9)
IgA/Immunoglobulin A, Serum: 488 mg/dL — ABNORMAL HIGH (ref 90–386)
IgG (Immunoglobin G), Serum: 1787 mg/dL — ABNORMAL HIGH (ref 603–1613)
IgM (Immunoglobulin M), Srm: 122 mg/dL (ref 20–172)
Total Protein: 7.6 g/dL (ref 6.0–8.5)

## 2022-06-10 LAB — B12 AND FOLATE PANEL
Folate: 9.6 ng/mL (ref >3.0)
Vitamin B-12: 519 pg/mL (ref 232–1245)

## 2022-06-10 LAB — RHEUMATOID FACTOR: Rheumatoid fact SerPl-aCnc: 10.8 IU/mL (ref <14.0)

## 2022-06-10 LAB — CK: Total CK: 329 U/L (ref 49–439)

## 2022-06-10 LAB — VITAMIN B1: Thiamine: 126.7 nmol/L (ref 66.5–200.0)

## 2022-06-10 LAB — C-REACTIVE PROTEIN: CRP: 3 mg/L (ref 0–10)

## 2022-06-10 LAB — VITAMIN B6: Vitamin B6: 12.8 ug/L (ref 3.4–65.2)

## 2022-06-10 LAB — ANA W/REFLEX: Anti Nuclear Antibody (ANA): NEGATIVE

## 2022-06-10 LAB — AMMONIA: Ammonia: 67 ug/dL (ref 40–200)

## 2022-06-10 LAB — SEDIMENTATION RATE: Sed Rate: 19 mm/hr — ABNORMAL HIGH (ref 0–15)

## 2022-06-12 DIAGNOSIS — G4733 Obstructive sleep apnea (adult) (pediatric): Secondary | ICD-10-CM | POA: Diagnosis not present

## 2022-06-13 ENCOUNTER — Telehealth: Payer: Self-pay | Admitting: *Deleted

## 2022-06-13 NOTE — Telephone Encounter (Signed)
-----   Message from Star Age, MD sent at 06/12/2022 10:10 AM EDT ----- Labs show a mild increase in sedimentation rate which is a nonspecific marker for inflammation, other labs benign for the most part, there was an increased level of immunoglobulins in his blood, this is also a nonspecific finding.  If he is agreeable, we can seek more input and clarification from hematology about the relevance of his increase in immunoglobulins in his blood.  Please make a referral to hematology if he is okay with pursuing consultation.  Indications would be: elevated IgG, abnormal protein electrophoresis, elevated IgA levels.  If he is interested in deferring hematology referral and speaking to PCP first, that is also fine.

## 2022-06-13 NOTE — Telephone Encounter (Signed)
I LMVM for pt to return call for labs. See lab results per Dr. Rexene Alberts below.

## 2022-06-14 ENCOUNTER — Encounter: Payer: Self-pay | Admitting: *Deleted

## 2022-06-14 NOTE — Telephone Encounter (Signed)
I relayed results of labs to pt per Dr. Rexene Alberts.     Labs show a mild increase in sedimentation rate which is a nonspecific marker for inflammation, other labs benign for the most part, there was an increased level of immunoglobulins in his blood, this is also a nonspecific finding.  If he is agreeable, we can seek more input and clarification from hematology about the relevance of his increase in immunoglobulins in his blood.  Please make a referral to hematology if he is okay with pursuing consultation.  Indications would be: elevated IgG, abnormal protein electrophoresis, elevated IgA levels.  If he is interested in deferring hematology referral and speaking to PCP first, that is also fine.  He said he would see pcp first.  I would send results to Dr. Volanda Napoleon.  He appreciated call back.

## 2022-06-14 NOTE — Telephone Encounter (Signed)
Pt returned call. Please call pt back when available.  

## 2022-06-14 NOTE — Telephone Encounter (Signed)
Opened in error

## 2022-07-04 ENCOUNTER — Other Ambulatory Visit (HOSPITAL_COMMUNITY): Payer: Self-pay | Admitting: Otolaryngology

## 2022-07-04 DIAGNOSIS — G4733 Obstructive sleep apnea (adult) (pediatric): Secondary | ICD-10-CM | POA: Insufficient documentation

## 2022-07-04 DIAGNOSIS — D104 Benign neoplasm of tonsil: Secondary | ICD-10-CM | POA: Insufficient documentation

## 2022-07-04 DIAGNOSIS — J392 Other diseases of pharynx: Secondary | ICD-10-CM | POA: Insufficient documentation

## 2022-07-04 DIAGNOSIS — R1314 Dysphagia, pharyngoesophageal phase: Secondary | ICD-10-CM

## 2022-07-04 DIAGNOSIS — K219 Gastro-esophageal reflux disease without esophagitis: Secondary | ICD-10-CM | POA: Diagnosis not present

## 2022-07-04 DIAGNOSIS — J3503 Chronic tonsillitis and adenoiditis: Secondary | ICD-10-CM | POA: Insufficient documentation

## 2022-07-04 DIAGNOSIS — E661 Drug-induced obesity: Secondary | ICD-10-CM | POA: Insufficient documentation

## 2022-07-04 HISTORY — DX: Benign neoplasm of tonsil: D10.4

## 2022-07-05 ENCOUNTER — Ambulatory Visit: Payer: BC Managed Care – PPO | Admitting: Family Medicine

## 2022-07-05 ENCOUNTER — Encounter: Payer: Self-pay | Admitting: Family Medicine

## 2022-07-05 VITALS — BP 138/92 | HR 85 | Temp 98.3°F | Wt 279.6 lb

## 2022-07-05 DIAGNOSIS — D890 Polyclonal hypergammaglobulinemia: Secondary | ICD-10-CM

## 2022-07-05 DIAGNOSIS — E118 Type 2 diabetes mellitus with unspecified complications: Secondary | ICD-10-CM

## 2022-07-05 DIAGNOSIS — Z7984 Long term (current) use of oral hypoglycemic drugs: Secondary | ICD-10-CM | POA: Diagnosis not present

## 2022-07-05 DIAGNOSIS — I10 Essential (primary) hypertension: Secondary | ICD-10-CM | POA: Diagnosis not present

## 2022-07-05 DIAGNOSIS — Z6839 Body mass index (BMI) 39.0-39.9, adult: Secondary | ICD-10-CM

## 2022-07-05 LAB — POCT GLYCOSYLATED HEMOGLOBIN (HGB A1C): Hemoglobin A1C: 6.4 % — AB (ref 4.0–5.6)

## 2022-07-05 MED ORDER — AMLODIPINE BESYLATE 5 MG PO TABS: 5.0000 mg | ORAL_TABLET | Freq: Every day | ORAL | 3 refills | Status: DC

## 2022-07-05 MED ORDER — METFORMIN HCL 500 MG PO TABS: 500.0000 mg | ORAL_TABLET | Freq: Every day | ORAL | 3 refills | Status: DC
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
## 2022-07-05 NOTE — Patient Instructions (Signed)
Hemoglobin A1C 6.4% this visit

## 2022-07-06 ENCOUNTER — Ambulatory Visit: Payer: BC Managed Care – PPO | Admitting: Family Medicine

## 2022-07-06 LAB — MICROALBUMIN / CREATININE URINE RATIO
Creatinine,U: 186.3 mg/dL
Microalb Creat Ratio: 2.5 mg/g (ref 0.0–30.0)
Microalb, Ur: 4.6 mg/dL — ABNORMAL HIGH (ref 0.0–1.9)

## 2022-07-10 ENCOUNTER — Ambulatory Visit (HOSPITAL_COMMUNITY)
Admission: RE | Admit: 2022-07-10 | Discharge: 2022-07-10 | Disposition: A | Discharge status: Home / Self Care | Payer: BC Managed Care – PPO | Source: Ambulatory Visit | Attending: Otolaryngology | Admitting: Otolaryngology

## 2022-07-10 DIAGNOSIS — K219 Gastro-esophageal reflux disease without esophagitis: Secondary | ICD-10-CM | POA: Diagnosis not present

## 2022-07-10 DIAGNOSIS — R1314 Dysphagia, pharyngoesophageal phase: Secondary | ICD-10-CM

## 2022-07-10 DIAGNOSIS — R1310 Dysphagia, unspecified: Secondary | ICD-10-CM | POA: Diagnosis not present

## 2022-07-11 ENCOUNTER — Telehealth: Payer: Self-pay | Admitting: Neurology

## 2022-07-11 NOTE — Telephone Encounter (Signed)
LVM and sent mychart msg informing pt of need to reschedule 08/02/22 appt - MD out

## 2022-07-12 ENCOUNTER — Ambulatory Visit: Payer: BC Managed Care – PPO | Admitting: Podiatry

## 2022-07-12 ENCOUNTER — Ambulatory Visit (INDEPENDENT_AMBULATORY_CARE_PROVIDER_SITE_OTHER): Payer: BC Managed Care – PPO

## 2022-07-12 DIAGNOSIS — M79671 Pain in right foot: Secondary | ICD-10-CM

## 2022-07-12 DIAGNOSIS — M7751 Other enthesopathy of right foot: Secondary | ICD-10-CM

## 2022-07-12 DIAGNOSIS — M79672 Pain in left foot: Secondary | ICD-10-CM

## 2022-07-12 DIAGNOSIS — M7752 Other enthesopathy of left foot: Secondary | ICD-10-CM | POA: Diagnosis not present

## 2022-07-12 DIAGNOSIS — G4733 Obstructive sleep apnea (adult) (pediatric): Secondary | ICD-10-CM | POA: Diagnosis not present

## 2022-07-12 MED ORDER — TRIAMCINOLONE ACETONIDE 10 MG/ML IJ SUSP
20.0000 mg | Freq: Once | INTRAMUSCULAR | Status: AC
Start: 2022-07-12 — End: 2022-07-12
  Administered 2022-07-12: 20 mg

## 2022-07-13 ENCOUNTER — Ambulatory Visit: Payer: BC Managed Care – PPO | Admitting: Podiatry
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
## 2022-07-14 ENCOUNTER — Telehealth: Payer: Self-pay | Admitting: Family Medicine

## 2022-07-14 NOTE — Telephone Encounter (Signed)
Samuel Valencia called regarding requested OV notes and bills  They are saying they still need the bills from office visits dated: 01/12/22 to present  Call Back:  [phone] Fax:           [phone]

## 2022-07-15 ENCOUNTER — Ambulatory Visit (INDEPENDENT_AMBULATORY_CARE_PROVIDER_SITE_OTHER): Payer: BC Managed Care – PPO

## 2022-07-15 ENCOUNTER — Ambulatory Visit (HOSPITAL_COMMUNITY)
Admission: EM | Admit: 2022-07-15 | Discharge: 2022-07-15 | Disposition: A | Discharge status: Home / Self Care | Payer: BC Managed Care – PPO | Attending: Emergency Medicine | Admitting: Emergency Medicine

## 2022-07-15 ENCOUNTER — Encounter (HOSPITAL_COMMUNITY): Payer: Self-pay

## 2022-07-15 DIAGNOSIS — S6991XA Unspecified injury of right wrist, hand and finger(s), initial encounter: Secondary | ICD-10-CM | POA: Diagnosis not present

## 2022-07-15 DIAGNOSIS — M7989 Other specified soft tissue disorders: Secondary | ICD-10-CM | POA: Diagnosis not present

## 2022-07-15 NOTE — Discharge Instructions (Addendum)
Rest - try to avoid heavy lifting and high impact activity Ice - apply for 20 minutes a few times daily Elevation - prop up on a pillow  Ibuprofen and/or tylenol for pain and inflammation  Give it a few days to heal up and improve. If still having pain in a week or so, please follow with the orthopedic specialists
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
## 2022-07-18 ENCOUNTER — Other Ambulatory Visit: Payer: Self-pay | Admitting: Podiatry

## 2022-07-18 DIAGNOSIS — M79671 Pain in right foot: Secondary | ICD-10-CM

## 2022-07-18 DIAGNOSIS — M7752 Other enthesopathy of left foot: Secondary | ICD-10-CM

## 2022-07-18 DIAGNOSIS — M7751 Other enthesopathy of right foot: Secondary | ICD-10-CM

## 2022-07-24 ENCOUNTER — Other Ambulatory Visit: Payer: Self-pay | Admitting: Otolaryngology

## 2022-07-26 ENCOUNTER — Encounter: Payer: Self-pay | Admitting: Podiatry

## 2022-07-26 ENCOUNTER — Ambulatory Visit: Payer: BC Managed Care – PPO | Admitting: Podiatry

## 2022-07-26 DIAGNOSIS — M7752 Other enthesopathy of left foot: Secondary | ICD-10-CM

## 2022-07-26 DIAGNOSIS — M7751 Other enthesopathy of right foot: Secondary | ICD-10-CM | POA: Diagnosis not present

## 2022-07-26 NOTE — Progress Notes (Signed)
Subjective:   Patient ID: Samuel Valencia, male   DOB: 47 y.o.   MRN: [id number]   HPI Patient states feeling quite a bit better still having some discomfort but improvement   ROS      Objective:  Physical Exam  Neurovascular status intact significant improvement in the sinus tarsi bilateral with medication and still using oral anti-inflammatory but reduce     Assessment:  Inflammatory sinus tarsitis bilateral improving with mild pain left at this time     Plan:  Reviewed condition and I do think eventually this will have to be repeated but at this point I am pleased with how things are doing.  Patient to be seen back as needed
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
## 2022-08-01 ENCOUNTER — Encounter: Payer: Self-pay | Admitting: Physician Assistant

## 2022-08-01 ENCOUNTER — Encounter (HOSPITAL_BASED_OUTPATIENT_CLINIC_OR_DEPARTMENT_OTHER): Payer: Self-pay | Admitting: Otolaryngology

## 2022-08-01 ENCOUNTER — Inpatient Hospital Stay: Payer: BC Managed Care – PPO | Attending: Physician Assistant | Admitting: Physician Assistant

## 2022-08-01 ENCOUNTER — Inpatient Hospital Stay: Payer: BC Managed Care – PPO

## 2022-08-01 ENCOUNTER — Other Ambulatory Visit: Payer: Self-pay

## 2022-08-01 VITALS — BP 148/88 | HR 87 | Temp 98.4°F | Resp 15 | Wt 273.9 lb

## 2022-08-01 DIAGNOSIS — D890 Polyclonal hypergammaglobulinemia: Secondary | ICD-10-CM | POA: Diagnosis not present

## 2022-08-01 DIAGNOSIS — G629 Polyneuropathy, unspecified: Secondary | ICD-10-CM | POA: Insufficient documentation

## 2022-08-01 LAB — CMP (CANCER CENTER ONLY)
ALT: 22 U/L (ref 0–44)
AST: 18 U/L (ref 15–41)
Albumin: 4.3 g/dL (ref 3.5–5.0)
Alkaline Phosphatase: 48 U/L (ref 38–126)
Anion gap: 8 (ref 5–15)
BUN: 9 mg/dL (ref 6–20)
CO2: 29 mmol/L (ref 22–32)
Calcium: 8.9 mg/dL (ref 8.9–10.3)
Chloride: 105 mmol/L (ref 98–111)
Creatinine: 0.92 mg/dL (ref 0.61–1.24)
GFR, Estimated: >60 mL/min (ref >60)
Glucose, Bld: 136 mg/dL — ABNORMAL HIGH (ref 70–99)
Potassium: 2.9 mmol/L — ABNORMAL LOW (ref 3.5–5.1)
Sodium: 142 mmol/L (ref 135–145)
Total Bilirubin: 0.7 mg/dL (ref 0.3–1.2)
Total Protein: 7.8 g/dL (ref 6.5–8.1)

## 2022-08-01 LAB — CBC WITH DIFFERENTIAL (CANCER CENTER ONLY)
Abs Immature Granulocytes: 0.01 10*3/uL (ref 0.00–0.07)
Basophils Absolute: 0.1 10*3/uL (ref 0.0–0.1)
Basophils Relative: 1 %
Eosinophils Absolute: 0.1 10*3/uL (ref 0.0–0.5)
Eosinophils Relative: 2 %
HCT: 40.7 % (ref 39.0–52.0)
Hemoglobin: 14.1 g/dL (ref 13.0–17.0)
Immature Granulocytes: 0 %
Lymphocytes Relative: 34 %
Lymphs Abs: 2.1 10*3/uL (ref 0.7–4.0)
MCH: 32.3 pg (ref 26.0–34.0)
MCHC: 34.6 g/dL (ref 30.0–36.0)
MCV: 93.3 fL (ref 80.0–100.0)
Monocytes Absolute: 0.9 10*3/uL (ref 0.1–1.0)
Monocytes Relative: 15 %
Neutro Abs: 3 10*3/uL (ref 1.7–7.7)
Neutrophils Relative %: 48 %
Platelet Count: 217 10*3/uL (ref 150–400)
RBC: 4.36 MIL/uL (ref 4.22–5.81)
RDW: 13 % (ref 11.5–15.5)
WBC Count: 6.2 10*3/uL (ref 4.0–10.5)
nRBC: 0 % (ref 0.0–0.2)

## 2022-08-02 ENCOUNTER — Encounter: Payer: BC Managed Care – PPO | Admitting: Neurology

## 2022-08-02 LAB — KAPPA/LAMBDA LIGHT CHAINS
Kappa free light chain: 25.2 mg/L — ABNORMAL HIGH (ref 3.3–19.4)
Kappa, lambda light chain ratio: 1.16 (ref 0.26–1.65)
Lambda free light chains: 21.7 mg/L (ref 5.7–26.3)

## 2022-08-03 ENCOUNTER — Other Ambulatory Visit: Payer: Self-pay | Admitting: *Deleted

## 2022-08-03 ENCOUNTER — Encounter (HOSPITAL_BASED_OUTPATIENT_CLINIC_OR_DEPARTMENT_OTHER)
Admission: RE | Admit: 2022-08-03 | Discharge: 2022-08-03 | Disposition: A | Discharge status: Home / Self Care | Payer: BC Managed Care – PPO | Source: Ambulatory Visit | Attending: Otolaryngology | Admitting: Otolaryngology

## 2022-08-03 DIAGNOSIS — D890 Polyclonal hypergammaglobulinemia: Secondary | ICD-10-CM

## 2022-08-03 DIAGNOSIS — Z01818 Encounter for other preprocedural examination: Secondary | ICD-10-CM | POA: Insufficient documentation

## 2022-08-03 DIAGNOSIS — G629 Polyneuropathy, unspecified: Secondary | ICD-10-CM | POA: Diagnosis not present

## 2022-08-03 LAB — BASIC METABOLIC PANEL
Anion gap: 11 (ref 5–15)
BUN: 10 mg/dL (ref 6–20)
CO2: 26 mmol/L (ref 22–32)
Calcium: 9.1 mg/dL (ref 8.9–10.3)
Chloride: 102 mmol/L (ref 98–111)
Creatinine, Ser: 1.05 mg/dL (ref 0.61–1.24)
GFR, Estimated: >60 mL/min (ref >60)
Glucose, Bld: 99 mg/dL (ref 70–99)
Potassium: 3.5 mmol/L (ref 3.5–5.1)
Sodium: 139 mmol/L (ref 135–145)

## 2022-08-03 NOTE — Progress Notes (Signed)

## 2022-08-09 ENCOUNTER — Encounter (HOSPITAL_BASED_OUTPATIENT_CLINIC_OR_DEPARTMENT_OTHER)
Admission: RE | Disposition: A | Discharge status: Home / Self Care | Payer: Self-pay | Source: Home / Self Care | Attending: Otolaryngology

## 2022-08-09 ENCOUNTER — Ambulatory Visit (HOSPITAL_BASED_OUTPATIENT_CLINIC_OR_DEPARTMENT_OTHER): Payer: BC Managed Care – PPO | Admitting: Anesthesiology

## 2022-08-09 ENCOUNTER — Other Ambulatory Visit: Payer: Self-pay

## 2022-08-09 ENCOUNTER — Encounter (HOSPITAL_BASED_OUTPATIENT_CLINIC_OR_DEPARTMENT_OTHER): Payer: Self-pay | Admitting: Otolaryngology

## 2022-08-09 ENCOUNTER — Ambulatory Visit (HOSPITAL_BASED_OUTPATIENT_CLINIC_OR_DEPARTMENT_OTHER)
Admission: RE | Admit: 2022-08-09 | Discharge: 2022-08-09 | Disposition: A | Discharge status: Home / Self Care | Payer: BC Managed Care – PPO | Attending: Otolaryngology | Admitting: Otolaryngology

## 2022-08-09 DIAGNOSIS — E119 Type 2 diabetes mellitus without complications: Secondary | ICD-10-CM | POA: Diagnosis not present

## 2022-08-09 DIAGNOSIS — Z79899 Other long term (current) drug therapy: Secondary | ICD-10-CM | POA: Diagnosis not present

## 2022-08-09 DIAGNOSIS — D104 Benign neoplasm of tonsil: Secondary | ICD-10-CM | POA: Insufficient documentation

## 2022-08-09 DIAGNOSIS — J351 Hypertrophy of tonsils: Secondary | ICD-10-CM | POA: Diagnosis not present

## 2022-08-09 DIAGNOSIS — J3503 Chronic tonsillitis and adenoiditis: Secondary | ICD-10-CM | POA: Diagnosis not present

## 2022-08-09 DIAGNOSIS — I10 Essential (primary) hypertension: Secondary | ICD-10-CM | POA: Insufficient documentation

## 2022-08-09 DIAGNOSIS — Z6837 Body mass index (BMI) 37.0-37.9, adult: Secondary | ICD-10-CM | POA: Insufficient documentation

## 2022-08-09 DIAGNOSIS — Z7984 Long term (current) use of oral hypoglycemic drugs: Secondary | ICD-10-CM | POA: Insufficient documentation

## 2022-08-09 DIAGNOSIS — J3489 Other specified disorders of nose and nasal sinuses: Secondary | ICD-10-CM | POA: Diagnosis not present

## 2022-08-09 DIAGNOSIS — E669 Obesity, unspecified: Secondary | ICD-10-CM | POA: Diagnosis not present

## 2022-08-09 DIAGNOSIS — D105 Benign neoplasm of other parts of oropharynx: Secondary | ICD-10-CM

## 2022-08-09 DIAGNOSIS — G4730 Sleep apnea, unspecified: Secondary | ICD-10-CM | POA: Insufficient documentation

## 2022-08-09 DIAGNOSIS — J349 Unspecified disorder of nose and nasal sinuses: Secondary | ICD-10-CM | POA: Diagnosis not present

## 2022-08-09 DIAGNOSIS — R1314 Dysphagia, pharyngoesophageal phase: Secondary | ICD-10-CM | POA: Insufficient documentation

## 2022-08-09 DIAGNOSIS — J352 Hypertrophy of adenoids: Secondary | ICD-10-CM

## 2022-08-09 DIAGNOSIS — D490 Neoplasm of unspecified behavior of digestive system: Secondary | ICD-10-CM | POA: Diagnosis not present

## 2022-08-09 HISTORY — DX: Sleep apnea, unspecified: G47.30

## 2022-08-09 HISTORY — PX: TONSILLECTOMY: SHX5217

## 2022-08-09 HISTORY — PX: NASOPHARYNGEAL BIOPSY: SHX6488

## 2022-08-09 HISTORY — DX: Gastro-esophageal reflux disease without esophagitis: K21.9

## 2022-08-09 LAB — UPEP/UIFE/LIGHT CHAINS/TP, 24-HR UR
% BETA, Urine: 17.5 %
ALPHA 1 URINE: 4.3 %
Albumin, U: 43.7 %
Alpha 2, Urine: 15 %
Free Kappa Lt Chains,Ur: 66.64 mg/L (ref 1.17–86.46)
Free Kappa/Lambda Ratio: 6.64 (ref 1.83–14.26)
Free Lambda Lt Chains,Ur: 10.03 mg/L (ref 0.27–15.21)
GAMMA GLOBULIN URINE: 19.6 %
Total Protein, Urine-Ur/day: 284 mg/24 hr — ABNORMAL HIGH (ref 30–150)
Total Protein, Urine: 13.5 mg/dL
Total Volume: 2150

## 2022-08-09 LAB — GLUCOSE, CAPILLARY
Glucose-Capillary: 132 mg/dL — ABNORMAL HIGH (ref 70–99)
Glucose-Capillary: 165 mg/dL — ABNORMAL HIGH (ref 70–99)

## 2022-08-09 SURGERY — TONSILLECTOMY
Anesthesia: General | Site: Throat | Laterality: Right

## 2022-08-09 MED ORDER — FENTANYL CITRATE (PF) 100 MCG/2ML IJ SOLN
INTRAMUSCULAR | Status: DC | PRN
  Administered 2022-08-09 (×2): 50 ug via INTRAVENOUS

## 2022-08-09 MED ORDER — EPINEPHRINE (ANAPHYLAXIS) 30 MG/30ML IJ SOLN
INTRAMUSCULAR | Status: DC | PRN
  Administered 2022-08-09: 10 mg

## 2022-08-09 MED ORDER — ACETAMINOPHEN 500 MG PO TABS
ORAL_TABLET | ORAL | Status: AC
  Filled 2022-08-09: qty 2

## 2022-08-09 MED ORDER — ONDANSETRON HCL 4 MG/2ML IJ SOLN
INTRAMUSCULAR | Status: AC
  Filled 2022-08-09: qty 2

## 2022-08-09 MED ORDER — LABETALOL HCL 5 MG/ML IV SOLN
INTRAVENOUS | Status: AC
  Filled 2022-08-09: qty 4

## 2022-08-09 MED ORDER — FENTANYL CITRATE (PF) 100 MCG/2ML IJ SOLN: 25.0000 ug | INTRAMUSCULAR | Status: DC | PRN

## 2022-08-09 MED ORDER — FENTANYL CITRATE (PF) 100 MCG/2ML IJ SOLN
INTRAMUSCULAR | Status: AC
  Filled 2022-08-09: qty 2

## 2022-08-09 MED ORDER — PROMETHAZINE HCL 25 MG/ML IJ SOLN: 6.2500 mg | INTRAMUSCULAR | Status: DC | PRN

## 2022-08-09 MED ORDER — SUGAMMADEX SODIUM 200 MG/2ML IV SOLN
INTRAVENOUS | Status: DC | PRN
  Administered 2022-08-09: 200 mg via INTRAVENOUS

## 2022-08-09 MED ORDER — LACTATED RINGERS IV SOLN: INTRAVENOUS | Status: DC

## 2022-08-09 MED ORDER — ONDANSETRON HCL 4 MG/2ML IJ SOLN
INTRAMUSCULAR | Status: DC | PRN
  Administered 2022-08-09: 4 mg via INTRAVENOUS

## 2022-08-09 MED ORDER — MIDAZOLAM HCL 2 MG/2ML IJ SOLN
INTRAMUSCULAR | Status: DC | PRN
  Administered 2022-08-09: 2 mg via INTRAVENOUS

## 2022-08-09 MED ORDER — ROCURONIUM BROMIDE 10 MG/ML (PF) SYRINGE
PREFILLED_SYRINGE | INTRAVENOUS | Status: DC | PRN
  Administered 2022-08-09: 60 mg via INTRAVENOUS

## 2022-08-09 MED ORDER — DEXAMETHASONE SODIUM PHOSPHATE 10 MG/ML IJ SOLN
INTRAMUSCULAR | Status: DC | PRN
  Administered 2022-08-09: 10 mg via INTRAVENOUS

## 2022-08-09 MED ORDER — ACETAMINOPHEN 500 MG PO TABS
1000.0000 mg | ORAL_TABLET | Freq: Once | ORAL | Status: AC
  Administered 2022-08-09: 1000 mg via ORAL

## 2022-08-09 MED ORDER — 0.9 % SODIUM CHLORIDE (POUR BTL) OPTIME
TOPICAL | Status: DC | PRN
  Administered 2022-08-09: 1000 mL

## 2022-08-09 MED ORDER — ROCURONIUM BROMIDE 10 MG/ML (PF) SYRINGE
PREFILLED_SYRINGE | INTRAVENOUS | Status: AC
  Filled 2022-08-09: qty 10

## 2022-08-09 MED ORDER — BUPIVACAINE-EPINEPHRINE (PF) 0.25% -1:200000 IJ SOLN
INTRAMUSCULAR | Status: AC
  Filled 2022-08-09: qty 30

## 2022-08-09 MED ORDER — PROPOFOL 10 MG/ML IV BOLUS
INTRAVENOUS | Status: DC | PRN
  Administered 2022-08-09: 60 mg via INTRAVENOUS
  Administered 2022-08-09: 180 mg via INTRAVENOUS

## 2022-08-09 MED ORDER — DEXAMETHASONE SODIUM PHOSPHATE 10 MG/ML IJ SOLN
INTRAMUSCULAR | Status: AC
  Filled 2022-08-09: qty 1

## 2022-08-09 MED ORDER — PROPOFOL 10 MG/ML IV BOLUS
INTRAVENOUS | Status: AC
  Filled 2022-08-09: qty 20

## 2022-08-09 MED ORDER — MIDAZOLAM HCL 2 MG/2ML IJ SOLN
INTRAMUSCULAR | Status: AC
  Filled 2022-08-09: qty 2

## 2022-08-09 MED ORDER — LIDOCAINE 2% (20 MG/ML) 5 ML SYRINGE
INTRAMUSCULAR | Status: AC
  Filled 2022-08-09: qty 5

## 2022-08-09 MED ORDER — LIDOCAINE 2% (20 MG/ML) 5 ML SYRINGE
INTRAMUSCULAR | Status: DC | PRN
  Administered 2022-08-09: 100 mg via INTRAVENOUS

## 2022-08-09 MED ORDER — BACITRACIN ZINC 500 UNIT/GM EX OINT
TOPICAL_OINTMENT | CUTANEOUS | Status: AC
  Filled 2022-08-09: qty 28.35

## 2022-08-09 MED ORDER — OXYMETAZOLINE HCL 0.05 % NA SOLN
NASAL | Status: AC
  Filled 2022-08-09: qty 30

## 2022-08-09 MED ORDER — LABETALOL HCL 5 MG/ML IV SOLN
5.0000 mg | INTRAVENOUS | Status: AC | PRN
  Administered 2022-08-09 (×4): 5 mg via INTRAVENOUS

## 2022-08-09 SURGICAL SUPPLY — 41 items
CANISTER SUCT 1200ML W/VALVE (MISCELLANEOUS) ×3 IMPLANT
CATH ROBINSON RED A/P 10FR (CATHETERS) ×3 IMPLANT
CATH ROBINSON RED A/P 14FR (CATHETERS) IMPLANT
CLEANER CAUTERY TIP 5X5 PAD (MISCELLANEOUS) ×3 IMPLANT
COAGULATOR SUCT SWTCH 10FR 6 (ELECTROSURGICAL) ×3 IMPLANT
CORD BIPOLAR FORCEPS 12FT (ELECTRODE) IMPLANT
COVER BACK TABLE 60X90IN (DRAPES) ×3 IMPLANT
COVER MAYO STAND STRL (DRAPES) ×3 IMPLANT
DEFOGGER MIRROR 1QT (MISCELLANEOUS) ×3 IMPLANT
ELECT COATED BLADE 2.86 ST (ELECTRODE) ×3 IMPLANT
ELECT REM PT RETURN 9FT ADLT (ELECTROSURGICAL)
ELECT REM PT RETURN 9FT PED (ELECTROSURGICAL)
ELECTRODE REM PT RETRN 9FT PED (ELECTROSURGICAL) IMPLANT
ELECTRODE REM PT RTRN 9FT ADLT (ELECTROSURGICAL) IMPLANT
FORCEPS BIPOLAR SPETZLER 8 1.0 (NEUROSURGERY SUPPLIES) IMPLANT
GAUZE SPONGE 4X4 12PLY STRL LF (GAUZE/BANDAGES/DRESSINGS) ×3 IMPLANT
GLOVE BIO SURGEON STRL SZ7.5 (GLOVE) ×3 IMPLANT
GLOVE BIOGEL PI IND STRL 8 (GLOVE) ×3 IMPLANT
GOWN STRL REUS W/ TWL LRG LVL3 (GOWN DISPOSABLE) ×3 IMPLANT
GOWN STRL REUS W/ TWL XL LVL3 (GOWN DISPOSABLE) ×3 IMPLANT
GOWN STRL REUS W/TWL LRG LVL3 (GOWN DISPOSABLE) ×2
GOWN STRL REUS W/TWL XL LVL3 (GOWN DISPOSABLE) ×2
KIT TURNOVER KIT B (KITS) ×3 IMPLANT
MANIFOLD NEPTUNE II (INSTRUMENTS) IMPLANT
NDL HYPO 27GX1-1/4 (NEEDLE) ×3 IMPLANT
NEEDLE HYPO 27GX1-1/4 (NEEDLE) ×2 IMPLANT
NS IRRIG 1000ML POUR BTL (IV SOLUTION) ×3 IMPLANT
PACK BASIN DAY SURGERY FS (CUSTOM PROCEDURE TRAY) IMPLANT
PENCIL SMOKE EVACUATOR (MISCELLANEOUS) ×3 IMPLANT
SHEATH ENDOSCRUB 0 DEG (SHEATH) IMPLANT
SHEET MEDIUM DRAPE 40X70 STRL (DRAPES) ×6 IMPLANT
SPONGE NEURO XRAY DETECT 1X3 (DISPOSABLE) IMPLANT
SPONGE TONSIL 1 RF SGL (DISPOSABLE) IMPLANT
SPONGE TONSIL 1.25 RF SGL STRG (GAUZE/BANDAGES/DRESSINGS) ×3 IMPLANT
SYR 3ML LL SCALE MARK (SYRINGE) IMPLANT
SYR BULB EAR ULCER 3OZ GRN STR (SYRINGE) ×3 IMPLANT
SYR CONTROL 10ML LL (SYRINGE) IMPLANT
TOWEL GREEN STERILE FF (TOWEL DISPOSABLE) ×3 IMPLANT
TUBE CONNECTING 20X1/4 (TUBING) ×6 IMPLANT
TUBE SALEM SUMP 16F (TUBING) ×3 IMPLANT
YANKAUER SUCT BULB TIP NO VENT (SUCTIONS) IMPLANT

## 2022-08-09 NOTE — Anesthesia Procedure Notes (Signed)
Procedure Name: Intubation Date/Time: 08/09/2022 9:20 AM  Performed by: Pearson Grippe, CRNAPre-anesthesia Checklist: Patient identified, Emergency Drugs available, Suction available and Patient being monitored Patient Re-evaluated:Patient Re-evaluated prior to induction Oxygen Delivery Method: Circle system utilized Preoxygenation: Pre-oxygenation with 100% oxygen Induction Type: IV induction Ventilation: Mask ventilation without difficulty and Two handed mask ventilation required Laryngoscope Size: Miller and 2 Grade View: Grade I Tube type: Oral Tube size: 7.5 mm Number of attempts: 1 Airway Equipment and Method: Stylet and Oral airway Placement Confirmation: ETT inserted through vocal cords under direct vision, positive ETCO2 and breath sounds checked- equal and bilateral Secured at: 24 cm Tube secured with: Tape Dental Injury: Teeth and Oropharynx as per pre-operative assessment

## 2022-08-09 NOTE — Transfer of Care (Signed)
Immediate Anesthesia Transfer of Care Note  Patient: Samuel Valencia  Procedure(s) Performed: EXCISION OF TONSIL PAPILLOMA (Right: Throat) ENDOSCOPIC TRANSNASAL BIOPSY OF ADENOID (Bilateral: Throat)  Patient Location: PACU  Anesthesia Type:General  Level of Consciousness: awake, alert , and oriented  Airway & Oxygen Therapy: Patient Spontanous Breathing and Patient connected to face mask oxygen  Post-op Assessment: Report given to RN and Post -op Vital signs reviewed and stable  Post vital signs: Reviewed and stable  Last Vitals:  Vitals Value Taken Time  BP 178/103 08/09/22 1000  Temp    Pulse 89 08/09/22 1001  Resp 19 08/09/22 1001  SpO2 100 % 08/09/22 1001  Vitals shown include unvalidated device data.  Last Pain:  Vitals:   08/09/22 0758  TempSrc: Oral  PainSc: 0-No pain      Patients Stated Pain Goal: 3 (08/09/22 0758)  Complications: No notable events documented.

## 2022-08-09 NOTE — Anesthesia Preprocedure Evaluation (Addendum)
Anesthesia Evaluation  Patient identified by MRN, date of birth, ID band Patient awake  General Assessment Comment:nlarged tonsils; Papilloma of tonsil; Chronic tonsillitis and adenoiditis; Lesion of nasopharynx; Pharyngoesophageal dysphagia  Reviewed: Allergy & Precautions, NPO status , Patient's Chart, lab work & pertinent test results  Airway Mallampati: III  TM Distance: >3 FB Neck ROM: Full    Dental  (+) Dental Advisory Given, Missing, Chipped,    Pulmonary sleep apnea and Continuous Positive Airway Pressure Ventilation    Pulmonary exam normal breath sounds clear to auscultation       Cardiovascular hypertension, Pt. on medications Normal cardiovascular exam Rhythm:Regular Rate:Normal     Neuro/Psych negative neurological ROS     GI/Hepatic Neg liver ROS,GERD  ,,  Endo/Other  diabetes, Type 2, Oral Hypoglycemic Agents  Obesity   Renal/GU negative Renal ROS     Musculoskeletal negative musculoskeletal ROS (+)    Abdominal   Peds  Hematology negative hematology ROS (+)   Anesthesia Other Findings Day of surgery medications reviewed with the patient.  Reproductive/Obstetrics                             Anesthesia Physical Anesthesia Plan  ASA: 2  Anesthesia Plan: General   Post-op Pain Management: Tylenol PO (pre-op)*   Induction: Intravenous  PONV Risk Score and Plan: 3 and Midazolam, Dexamethasone and Ondansetron  Airway Management Planned: Oral ETT  Additional Equipment:   Intra-op Plan:   Post-operative Plan: Extubation in OR  Informed Consent: I have reviewed the patients History and Physical, chart, labs and discussed the procedure including the risks, benefits and alternatives for the proposed anesthesia with the patient or authorized representative who has indicated his/her understanding and acceptance.     Dental advisory given  Plan Discussed with:  CRNA  Anesthesia Plan Comments:        Anesthesia Quick Evaluation

## 2022-08-09 NOTE — Discharge Instructions (Addendum)
Throat surgery Discharge instructions (you had a biopsy of a tonsil papilloma and biopsy of your adenoids)  Effects of Anesthesia Tonsillectomy (with or without Adenoidectomy) involves a brief anesthesia,  typically 20 - 60 minutes. Patients may be quite irritable for several hours after  surgery. If sedatives were given, some patients will remain sleepy for much of the  day. Nausea and vomiting is occasionally seen, and usually resolves by the  evening of surgery - even without additional medications. Medications Tonsillectomy is a painful procedure. Pain medications help but do not  completely alleviate the discomfort.   YOUNGER CHILDREN  Younger children should be given Tylenol Elixir and Motrin Elixir, with  dosing based on weight (see chart below). Start by giving scheduled  Tylenol every 6 hours. If this does not control the pain, you can  ALTERNATE between Tylenol and Motrin and give a dose every 3 hours  (i.e. Tylenol given at 12pm, then Motrin at 3pm then Tylenol at 6pm). Many  children do not like the taste of liquid medications, so you may substitute  Tylenol and Motrin chewables for elixir prescribed. Below are the doses for  both. It is fine to use generic store brands instead of brand name -- [employer] generic has a taste tolerated by most children. You do not  need to wait for your child to complain of pain to give them medication,  scheduled dosing of medications will control the pain more effectively.     ADULTS  Adults will be prescribed a narcotic pain pill or elixir (Percocet, Norco,  Vicodin, Lortab are some examples). Do not use aspirin products (Bayer's,  Goode powders, Excedrin) - they may increase the chance of bleeding.  Every time you take a dose of pain medication, do so with some food or full  liquid to prevent nausea. The best thing to take with the medication is a  cup of pudding or ice cream, a milkshake or cup of milk.   Activity  Vigorous  exercise should be avoided for 14 days after surgery. This risk of  bleeding is increased with increased activity and bleeding from where the tonsils  were removed can happen for up to 2 weeks after surgery. Baths and showers are fine. Many patients have reduced energy levels until their pain decreases and  they are taking in more nourishment and calories. You should not travel out of  the local area for a full 2 weeks after surgery in case you experience bleeding  after surgery.   Eating & Drinking Dehydration is the biggest enemy in the recovery period. It will increase the pain,  increase the risk of bleeding and delay the healing. It usually happens because  the pain of swallowing keeps the patient from drinking enough liquids. Therefore,  the key is to force fluids, and that works best when pain control is maximized. You cannot drink too much after having a tonsillectomy. The only drinks to avoid  are citrus like orange and grapefruit juices because they will burn the back of the  throat. Incentive charts with prizes work very well to get young children to drink  fluids and take their medications after surgery. Some patients will have a small  amount of liquid come out of their nose when they drink after surgery, this should  stop within a few weeks after surgery.  Although drinking is more important, eating is fine even the day of surgery but  avoid foods that are crunchy or have sharp edges. Dairy  products may be taken,  if desired. You should avoid acidic, salty and spicy foods (especially tomato  sauces). Chewing gum or bubble gum encourages swallowing and saliva flow,  and may even speed up the healing. Almost everyone loses some weight after  tonsillectomy (which is usually regained in the 2nd or 3rd week after surgery).  Drinking is far more important that eating in the first 14 days after surgery, so  concentrate on that first and foremost. Adequate liquid intake probably  speeds  Recovery.  Other things.  Pain is usually the worst in the morning; this can be avoided by overnight  medication administration if needed.  Since moisture helps soothe the healing throat, a room humidifier (hot or  cold) is suggested when the patient is sleeping.  Some patients feel pain relief with an ice collar to the neck (or a bag of  frozen peas or corn). Be careful to avoid placing cold plastic directly on the  skin - wrap in a paper towel or washcloth.   If the tonsils and adenoids are very large, the patient's voice may change  after surgery.  The recovery from tonsillectomy is a very painful period, often the worst  pain people can recall, so please be understanding and patient with  yourself, or the patient you are caring for. It is helpful to take pain  medicine during the night if the patient awakens-- the worst pain is usually  in the morning. The pain may seem to increase 2-5 days after surgery - this is normal when inflammation sets in. Please be aware that no  combination of medicines will eliminate the pain - the patient will need to  continue eating/drinking in spite of the remaining discomfort.  You should not travel outside of the local area for 14 days after surgery in  case significant bleeding occurs.   What should we expect after surgery? As previously mentioned, most patients have a significant amount of pain after  tonsillectomy, with pain resolving 7-14 days after surgery. Older children and  adults seem to have more discomfort. Most patients can go home the day of  surgery.  Ear pain: Many people will complain of earaches after tonsillectomy. This  is caused by referred pain coming from throat and not the ears. Give pain  medications and encourage liquid intake.  Fever: Many patients have a low-grade fever after tonsillectomy - up to  101.5 degrees (380 C.) for several days. Higher prolonged fever should be  reported to your surgeon.  Bad looking  (and bad smelling) throat: After surgery, the place where  the tonsils were removed is covered with a white film, which is a moist  scab. This usually develops 3-5 days after surgery and falls off 10-14 days  after surgery and usually causes bad breath. There will be some redness  and swelling as well. The uvula (the part of the throat that hangs down in  the middle between the tonsils) is usually swollen for several days after  surgery.  Sore/bruised feeling of Tongue: This is common for the first few days  after surgery because the tongue is pushed out of the way to take out the  tonsils in surgery.  When should we call the doctor?  Nausea/Vomiting: This is a common side effect from General Anesthesia  and can last up to 24-36 hours after surgery. Try giving sips of clear liquids  like Sprite, water or apple juice then gradually increase fluid intake. If the  nausea  or vomiting continues beyond this time frame, call the doctor's  office for medications that will help relieve the nausea and vomiting.  Bleeding: Significant bleeding is rare, but it happens to about 5% of  patients who have tonsillectomy. It may come from the nose, the mouth, or  be vomited or coughed up. Ice water mouthwashes may help stop or  reduce bleeding. If you have bleeding that does not stop, you should call  the office (during business hours) or the on call physician (evenings, weekends) or go to the emergency room if you are very concerned.   Dehydration: If there has been little or no liquids intake for 24 hours, the  patient may need to come to the hospital for IV fluids. Signs of dehydration  include lethargy, the lack of tears when crying, and reduced or very  concentrated urine output.  High Fever: If the patient has a consistent temperatures greater than 102,  or when accompanied by cough or difficulty breathing, you should call the  doctor's office.  If you run out of pain medication: Some patients run  out of pain  medications prescribed after surgery. If you need more, call the office DURING BUSINESS HOURS and more will be prescribed. Keep an eye  on your prescription so that you don't run out completely before you can  pick up more, especially before the weekend  Call [phone] to reach the on-call ENT Physician at [facility]    Post Anesthesia Home Care Instructions  Activity: Get plenty of rest for the remainder of the day. A responsible individual must stay with you for 24 hours following the procedure.  For the next 24 hours, DO NOT: -Drive a car -Advertising copywriter -Drink alcoholic beverages -Take any medication unless instructed by your physician -Make any legal decisions or sign important papers.  Meals: Start with liquid foods such as gelatin or soup. Progress to regular foods as tolerated. Avoid greasy, spicy, heavy foods. If nausea and/or vomiting occur, drink only clear liquids until the nausea and/or vomiting subsides. Call your physician if vomiting continues.  Special Instructions/Symptoms: Your throat may feel dry or sore from the anesthesia or the breathing tube placed in your throat during surgery. If this causes discomfort, gargle with warm salt water. The discomfort should disappear within 24 hours.  If you had a scopolamine patch placed behind your ear for the management of post- operative nausea and/or vomiting:  1. The medication in the patch is effective for 72 hours, after which it should be removed.  Wrap patch in a tissue and discard in the trash. Wash hands thoroughly with soap and water. 2. You may remove the patch earlier than 72 hours if you experience unpleasant side effects which may include dry mouth, dizziness or visual disturbances. 3. Avoid touching the patch. Wash your hands with soap and water after contact with the patch.    Next dose of tylenol if needed for pain is due at 2:01pm

## 2022-08-09 NOTE — Anesthesia Postprocedure Evaluation (Signed)
Anesthesia Post Note  Patient: Samuel Valencia  Procedure(s) Performed: EXCISION OF TONSIL PAPILLOMA (Right: Throat) ENDOSCOPIC TRANSNASAL BIOPSY OF ADENOID (Bilateral: Throat)     Patient location during evaluation: PACU Anesthesia Type: General Level of consciousness: awake Pain management: pain level controlled Vital Signs Assessment: post-procedure vital signs reviewed and stable Respiratory status: spontaneous breathing, nonlabored ventilation and respiratory function stable Cardiovascular status: blood pressure returned to baseline and stable Postop Assessment: no apparent nausea or vomiting Anesthetic complications: no   No notable events documented.  Last Vitals:  Vitals:   08/09/22 1059 08/09/22 1100  BP: (!) 168/111 (!) 163/108  Pulse: 80 82  Resp:    Temp:    SpO2:      Last Pain:  Vitals:   08/09/22 1043  TempSrc:   PainSc: 6                  Linton Rump

## 2022-08-09 NOTE — Op Note (Signed)
OPERATIVE NOTE  Samuel Valencia Date/Time of Admission: 08/09/2022  7:40 AM  CSN: [id number];MRN:[id number] Attending Provider: Scarlette Ar, MD Room/Bed: MCSP/NONE DOB: May 04, 1975 Age: 47 y.o.   Pre-Op Diagnosis: Enlarged tonsils; Papilloma of tonsil; Chronic tonsillitis and adenoiditis; Lesion of nasopharynx; Pharyngoesophageal dysphagia  Post-Op Diagnosis: Enlarged tonsils; Papilloma of tonsil; Chronic tonsillitis and adenoiditis; Lesion of nasopharynx; Pharyngoesophageal dysphagia  Procedure: Procedure(s): EXCISION OF TONSIL PAPILLOMA ENDOSCOPIC TRANSNASAL BIOPSY OF ADENOID  Anesthesia: General  Surgeon(s): Mervin Kung, MD  Staff: Circulator: Randalyn Rhea, RN Relief Circulator: Raliegh Scarlet, RN Scrub Person: Lilia Argue J  Implants: * No implants in log *  Specimens: ID Type Source Tests Collected by Time Destination  1 : Right tonsil papilloma Tissue PATH Soft tissue SURGICAL PATHOLOGY Scarlette Ar, MD 08/09/2022 0935   2 : Adenoid biopsy Tissue PATH Tonsil/Adenoid SURGICAL PATHOLOGY Scarlette Ar, MD 08/09/2022 [phone]     Complications: none  EBL: 20 ML  IVF: Per anesthesia ML  Condition: stable  Operative Findings:  Right soft palate papilloma excised Adenoid hypertrophy s/p incisional biopsy   Description of Operation:   Once operative consent was obtained, and the surgical site confirmed with the operating room team, the patient was brought back to the operating room and general endotracheal anesthesia was obtained. The patient was turned over to the ENT service. A Crow-Davis mouth gag was used to expose the oral cavity and oropharynx. Attention was first turned to the right tonsil. The papilloma was grasped with brown forceps and excised sharply. Hemostasis was achieved with epinephrine soaked pledgets.     Attention was turned to the adenoid bed using 0 degree rigid nasal endoscopy.  The nasal cavity was de-congested with  1:1000 epinephrine pledgets. The right inferior turbinate was gently lateralized. Sharp Blakesley forceps were used to obtain an incisional biopsy of the adenoid hypertrophy. Hemostasis was achieved with 1:1000 adrenalin pledgets and suction cautery.    . An oral gastric tube was placed into the stomach and suctioned to reduce postoperative nausea. The patient was turned back over to the anesthesia service. The patient was then transferred to the PACU in stable condition.    Mervin Kung, MD [facility] ENT  08/09/2022

## 2022-08-09 NOTE — H&P (Signed)
Samuel Valencia is an 47 y.o. male.    Chief Complaint:  Right tonsil papilloma, adenoid hypertrophy  HPI: Patient presents today for planned elective procedure.  He/she denies any interval change in history since office visit on 07/04/22.  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 He has never used smokeless tobacco. He reports that he does not drink alcohol and does not use drugs.  Allergies: No Known Allergies  Medications Prior to Admission  Medication Sig Dispense Refill   amLODipine (NORVASC) 5 MG tablet Take 1 tablet (5 mg total) by mouth daily. 90 tablet 3   metFORMIN (GLUCOPHAGE) 500 MG tablet Take 1 tablet (500 mg total) by mouth daily with breakfast. 90 tablet 3    No results found for this or any previous visit (from the past 48 hour(s)). No results found.  ROS: negative other than stated in HPI  Height 5\' 11"  (1.803 m), weight 121.1 kg.  PHYSICAL EXAM: General: Resting comfortably in NAD  Lungs: Non-labored respiratinos  Studies Reviewed: none   Assessment/Plan Right tonsil papilloma Adenoid hypertrophy  Proceed with excisional biopsy right tonsil papilloma, nasal endoscopy with biopsy of adenoid hypertrophy under GA. Informed consent obtained.     Electronically signed by:  Scarlette Ar, MD  Staff Physician Facial Plastic & Reconstructive Surgery Otolaryngology - Head and Neck Surgery [facility]  [facility]  08/09/2022, 7:53 AM

## 2022-08-10 ENCOUNTER — Encounter (HOSPITAL_BASED_OUTPATIENT_CLINIC_OR_DEPARTMENT_OTHER): Payer: Self-pay | Admitting: Otolaryngology

## 2022-08-10 ENCOUNTER — Ambulatory Visit: Payer: Commercial Managed Care - PPO | Admitting: Adult Health

## 2022-08-10 LAB — MULTIPLE MYELOMA PANEL, SERUM
Albumin SerPl Elph-Mcnc: 3.8 g/dL (ref 2.9–4.4)
Albumin/Glob SerPl: 1.1 (ref 0.7–1.7)
Alpha 1: 0.2 g/dL (ref 0.0–0.4)
Alpha2 Glob SerPl Elph-Mcnc: 0.5 g/dL (ref 0.4–1.0)
B-Globulin SerPl Elph-Mcnc: 1.1 g/dL (ref 0.7–1.3)
Gamma Glob SerPl Elph-Mcnc: 1.6 g/dL (ref 0.4–1.8)
Globulin, Total: 3.5 g/dL (ref 2.2–3.9)
IgA: 479 mg/dL — ABNORMAL HIGH (ref 90–386)
IgG (Immunoglobin G), Serum: 1858 mg/dL — ABNORMAL HIGH (ref 603–1613)
IgM (Immunoglobulin M), Srm: 120 mg/dL (ref 20–172)
Total Protein ELP: 7.3 g/dL (ref 6.0–8.5)

## 2022-08-10 LAB — SURGICAL PATHOLOGY

## 2022-08-12 DIAGNOSIS — G4733 Obstructive sleep apnea (adult) (pediatric): Secondary | ICD-10-CM | POA: Diagnosis not present

## 2022-08-14 ENCOUNTER — Telehealth: Payer: Self-pay | Admitting: Physician Assistant

## 2022-08-14 NOTE — Telephone Encounter (Signed)
I called Mr. Samuel Valencia to review the lab results from 08/01/2022. Findings showed polyclonal increase without evidence of monoclonal protein in both blood or urine. No evidence to suggest underlying bone marrow disorder so do not recommend further hematologic workup. Patient can follow up with neurologist for further evaluation of underlying polyneuropathy.   Patient's potassium level on 08/01/2022 was 2.9 but resolved when labs were rechecked on 08/03/2022. Advised to follow up with PCP to monitor levels.   Patient can return to our clinic as needed.

## 2022-08-25 DIAGNOSIS — H6993 Unspecified Eustachian tube disorder, bilateral: Secondary | ICD-10-CM | POA: Diagnosis not present

## 2022-08-25 DIAGNOSIS — D104 Benign neoplasm of tonsil: Secondary | ICD-10-CM | POA: Diagnosis not present

## 2022-09-11 DIAGNOSIS — G4733 Obstructive sleep apnea (adult) (pediatric): Secondary | ICD-10-CM | POA: Diagnosis not present

## 2022-10-12 DIAGNOSIS — G4733 Obstructive sleep apnea (adult) (pediatric): Secondary | ICD-10-CM | POA: Diagnosis not present

## 2022-10-25 ENCOUNTER — Ambulatory Visit (INDEPENDENT_AMBULATORY_CARE_PROVIDER_SITE_OTHER): Payer: BC Managed Care – PPO | Admitting: Neurology

## 2022-10-25 ENCOUNTER — Ambulatory Visit: Payer: Self-pay | Admitting: Neurology

## 2022-10-25 DIAGNOSIS — G5603 Carpal tunnel syndrome, bilateral upper limbs: Secondary | ICD-10-CM

## 2022-10-25 DIAGNOSIS — R202 Paresthesia of skin: Secondary | ICD-10-CM

## 2022-10-25 DIAGNOSIS — R030 Elevated blood-pressure reading, without diagnosis of hypertension: Secondary | ICD-10-CM

## 2022-10-25 DIAGNOSIS — R7309 Other abnormal glucose: Secondary | ICD-10-CM

## 2022-10-25 DIAGNOSIS — Z0289 Encounter for other administrative examinations: Secondary | ICD-10-CM

## 2022-10-25 DIAGNOSIS — E669 Obesity, unspecified: Secondary | ICD-10-CM

## 2022-10-25 NOTE — Procedures (Signed)
Full Name: Samuel Valencia Gender: Male MRN #: [id number] Date of Birth: [birth year]    Visit Date: 10/25/2022 09:16 Age: 47 Years Examining Physician: Dr. Levert Feinstein Referring Physician: Dr. Gareth Morgan Height: 5 feet 11 inch History: 47 year old male complains of bilateral hands and feet paresthesia  Summary of the test:  Nerve conduction study:  Right sural, superficial peroneal, bilateral ulnar sensory responses were normal. Bilateral median sensory responses showed moderately prolonged peak latency within normal range snap amplitude.  Right radial sensory response was normal.  Right peroneal to EDB, tibial motor responses were normal, with exception of decreased CMAP amplitude at right tibial proximal stimulation site, likely due to suboptimal stimulation from his big body habitus.  Bilateral ulnar, median motor responses were normal.  Electromyography: Selected needle examination of right lower extremity muscle, right lumbosacral paraspinals; right upper extremity muscles and right cervical paraspinal muscles were normal.  Conclusion: This is an abnormal study.  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  There is no evidence of large fiber peripheral neuropathy, right cervical radiculopathy or right lumbosacral radiculopathy.    ------------------------------- Levert Feinstein, M.D. PhD  [facility] [address] [place], [place] [address] Tel: [phone] Fax: [phone]  Verbal informed consent was obtained from the patient, patient was informed of potential risk of procedure, including bruising, bleeding, hematoma formation, infection, muscle weakness, muscle pain, numbness, among others.        MNC    Nerve / Sites Muscle Latency Ref. Amplitude Ref. Rel Amp Segments Distance Velocity Ref. Area    ms ms mV mV %  cm m/s m/s mVms  R Median - APB      Wrist APB 4.3 ?4.4 6.5 ?4.0 100 Wrist - APB 7   27.3     Upper arm APB 9.7  6.4  97.7 Upper arm - Wrist 27 50 ?49 22.0  L Median - APB     Wrist APB 4.1 ?4.4 13.1 ?4.0 100 Wrist - APB 7   39.1     Upper arm APB 10.1  10.6  81.2 Upper arm - Wrist 29 49 ?49 36.9  R Ulnar - ADM     Wrist ADM 2.5 ?3.3 8.0 ?6.0 100 Wrist - ADM 7   22.1     B.Elbow ADM 4.8  6.7  84.1 B.Elbow - Wrist 13 56 ?49 19.7     A.Elbow ADM 8.4  5.8  86.7 A.Elbow - B.Elbow 18 50 ?49 19.2  L Ulnar - ADM     Wrist ADM 2.7 ?3.3 7.9 ?6.0 100 Wrist - ADM 7   27.4     B.Elbow ADM 5.5  7.3  92.9 B.Elbow - Wrist 15 53 ?49 25.7     A.Elbow ADM 9.2  6.3  85.6 A.Elbow - B.Elbow 19 52 ?49 21.4  R Peroneal - EDB     Ankle EDB 5.1 ?6.5 4.9 ?2.0 100 Ankle - EDB 9   12.2     Fib head EDB 12.5  5.7  117 Fib head - Ankle 32.6 44 ?44 15.3     Pop fossa EDB 14.9  4.6  80.2 Pop fossa - Fib head 11 45 ?44 11.8         Pop fossa - Ankle      R Tibial - AH     Ankle AH 4.5 ?5.8 4.7 ?4.0 100 Ankle - AH 9  8.7     Pop fossa AH 14.7  2.4  50.6 Pop fossa - Ankle 46 45 ?41 5.4                 SNC    Nerve / Sites Rec. Site Peak Lat Ref.  Amp Ref. Segments Distance Peak Diff Ref.    ms ms V V  cm ms ms  R Radial - Anatomical snuff box (Forearm)     Forearm Wrist 2.2 ?2.9 17 ?15 Forearm - Wrist 10    R Sural - Ankle (Calf)     Calf Ankle 4.0 ?4.4 8 ?6 Calf - Ankle 14    R Superficial peroneal - Ankle     Lat leg Ankle 4.0 ?4.4 12 ?6 Lat leg - Ankle 14    R Median, Ulnar - Transcarpal comparison     Median Palm Wrist 3.4 ?2.2 14 ?35 Median Palm - Wrist 8       Ulnar Palm Wrist 2.0 ?2.2 8 ?12 Ulnar Palm - Wrist 8          Median Palm - Ulnar Palm  1.4 ?0.4  L Median, Ulnar - Transcarpal comparison     Median Palm Wrist 3.4 ?2.2 15 ?35 Median Palm - Wrist 8       Ulnar Palm Wrist 1.9 ?2.2 10 ?12 Ulnar Palm - Wrist 8          Median Palm - Ulnar Palm  1.4 ?0.4  R Median - Orthodromic (Dig II, Mid palm)     Dig II Wrist 4.3 ?3.4 13 ?10 Dig  II - Wrist 13    L Median - Orthodromic (Dig II, Mid palm)     Dig II Wrist 4.3 ?3.4 15 ?10 Dig II - Wrist 13    R Ulnar - Orthodromic, (Dig V, Mid palm)     Dig V Wrist 3.0 ?3.1 5 ?5 Dig V - Wrist 11    L Ulnar - Orthodromic, (Dig V, Mid palm)     Dig V Wrist 3.0 ?3.1 13 ?5 Dig V - Wrist 77                         F  Wave    Nerve F Lat Ref.   ms ms  R Ulnar - ADM 30.7 ?32.0  R Tibial - AH 49.4 ?56.0  L Ulnar - ADM 30.3 ?32.0           H Reflex    Nerve H Lat Lat Hmax   ms ms   Left Right Ref. Left Right Ref.  Tibial - Soleus 40.6 40.4 ?35.0 20.3 19.8 ?35.0         EMG Summary Table    Spontaneous MUAP Recruitment  Muscle IA Fib PSW Fasc Other Amp Dur. Poly Pattern  R. First dorsal interosseous Normal None None None _______ Normal Normal Normal Normal  R. Pronator teres Normal None None None _______ Normal Normal Normal Normal  R. Biceps brachii Normal None None None _______ Normal Normal Normal Normal  R. Deltoid Normal None None None _______ Normal Normal Normal Normal  R. Triceps brachii Normal None None None _______ Normal Normal Normal Normal  R. Cervical paraspinals Normal None None None _______ Normal Normal Normal Normal  R. Tibialis anterior Normal None None None _______ Normal Normal Normal Normal  R. Tibialis posterior Normal None None None _______ Normal Normal Normal Normal  R. Peroneus longus Normal None  None None _______ Normal Normal Normal Normal  R. Gastrocnemius (Medial head) Normal None None None _______ Normal Normal Normal Normal  R. Vastus lateralis Normal None None None _______ Normal Normal Normal Normal  R. Lumbar paraspinals (low) Normal None None None _______ Normal Normal Normal Normal  R. Lumbar paraspinals (mid) Normal None None None _______ Normal Normal Normal Normal
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
## 2022-10-26 DIAGNOSIS — G5603 Carpal tunnel syndrome, bilateral upper limbs: Secondary | ICD-10-CM

## 2022-11-01 ENCOUNTER — Ambulatory Visit: Payer: BC Managed Care – PPO

## 2022-11-01 ENCOUNTER — Ambulatory Visit: Payer: BC Managed Care – PPO | Admitting: Podiatry

## 2022-11-01 ENCOUNTER — Encounter: Payer: Self-pay | Admitting: Podiatry

## 2022-11-01 DIAGNOSIS — M7751 Other enthesopathy of right foot: Secondary | ICD-10-CM

## 2022-11-01 DIAGNOSIS — M7752 Other enthesopathy of left foot: Secondary | ICD-10-CM | POA: Diagnosis not present

## 2022-11-01 MED ORDER — TRIAMCINOLONE ACETONIDE 10 MG/ML IJ SUSP
10.0000 mg | Freq: Once | INTRAMUSCULAR | Status: AC
Start: 2022-11-01 — End: 2022-11-01
  Administered 2022-11-01: 10 mg via INTRA_ARTICULAR

## 2022-11-01 NOTE — Progress Notes (Signed)
  Patient was seen, measured for custom molded foot orthotics patient has Pes Planus deformity however states he rolls outward over supinating lateral posting will be applied  Patient will benefit from CFO's as they will help provide total contact to MLA's helping to better distribute body weight across BIL feet greater reducing plantar pressure and pain and to also encourage FF and RF alignment  Patient was scanned items to be ordered and fit when in   [employer], CFo, CFm

## 2022-11-02 NOTE — Progress Notes (Signed)
Subjective:   Patient ID: Samuel Valencia, Samuel Valencia   DOB: 47 y.o.   MRN: [id number]   HPI Patient presents stating that he still gets a reoccurrence of discomfort into his subtalar joint joints of both feet states that they do improved for periods of time after treatment but then recur.  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  I did do sterile prep I injected the sinus tarsi bilateral 3 mg Kenalog 5 mg Xylocaine I applied and I had pedorthist come in who evaluated and fitted him properly for customized orthotics to reduce stress on his feet

## 2022-11-12 DIAGNOSIS — G4733 Obstructive sleep apnea (adult) (pediatric): Secondary | ICD-10-CM | POA: Diagnosis not present

## 2022-11-16 NOTE — Telephone Encounter (Signed)
Samuel Valencia called back regarding his NCV EMG results. He would like to move forward with a consult for hand surgery. Please move forward with the referral for same.

## 2022-11-23 NOTE — Telephone Encounter (Signed)
Noted  

## 2022-11-23 NOTE — Telephone Encounter (Signed)
Referral to hand surgery placed. 

## 2022-11-24 ENCOUNTER — Telehealth: Payer: Self-pay | Admitting: Neurology

## 2022-11-24 NOTE — Telephone Encounter (Signed)
Referral for hand surgery fax to [facility]. Phone: [phone], Fax: [phone].

## 2022-11-29 ENCOUNTER — Encounter: Payer: Self-pay | Admitting: Pharmacist

## 2022-11-29 ENCOUNTER — Ambulatory Visit: Payer: BC Managed Care – PPO

## 2022-11-29 NOTE — Progress Notes (Signed)
Patient presents today to pick up custom molded foot orthotics, diagnosed with Pes Planus deformity by Dr. Charlsie Merles .   Orthotics were dispensed and fit was satisfactory. Reviewed instructions for break-in and wear. Written instructions given to patient.  Patient will follow up as needed.   Addison Bailey CPed, CFo, CFm

## 2022-12-05 DIAGNOSIS — G5603 Carpal tunnel syndrome, bilateral upper limbs: Secondary | ICD-10-CM | POA: Diagnosis not present

## 2022-12-08 ENCOUNTER — Ambulatory Visit (INDEPENDENT_AMBULATORY_CARE_PROVIDER_SITE_OTHER): Payer: BC Managed Care – PPO | Admitting: Family Medicine

## 2022-12-08 ENCOUNTER — Encounter: Payer: Self-pay | Admitting: Family Medicine

## 2022-12-08 VITALS — BP 140/76 | HR 80 | Temp 97.8°F | Ht 71.0 in | Wt 270.0 lb

## 2022-12-08 DIAGNOSIS — E118 Type 2 diabetes mellitus with unspecified complications: Secondary | ICD-10-CM

## 2022-12-08 DIAGNOSIS — Z6837 Body mass index (BMI) 37.0-37.9, adult: Secondary | ICD-10-CM

## 2022-12-08 DIAGNOSIS — Z23 Encounter for immunization: Secondary | ICD-10-CM

## 2022-12-08 DIAGNOSIS — Z7984 Long term (current) use of oral hypoglycemic drugs: Secondary | ICD-10-CM | POA: Diagnosis not present

## 2022-12-08 DIAGNOSIS — I10 Essential (primary) hypertension: Secondary | ICD-10-CM | POA: Diagnosis not present
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
## 2022-12-09 LAB — COMPREHENSIVE METABOLIC PANEL
AG Ratio: 1.3 (calc) (ref 1.0–2.5)
ALT: 21 U/L (ref 9–46)
AST: 15 U/L (ref 10–40)
Albumin: 4.3 g/dL (ref 3.6–5.1)
Alkaline phosphatase (APISO): 57 U/L (ref 36–130)
BUN: 16 mg/dL (ref 7–25)
CO2: 32 mmol/L (ref 20–32)
Calcium: 9.6 mg/dL (ref 8.6–10.3)
Chloride: 101 mmol/L (ref 98–110)
Creat: 1.02 mg/dL (ref 0.60–1.29)
Globulin: 3.2 g/dL (ref 1.9–3.7)
Glucose, Bld: 139 mg/dL — ABNORMAL HIGH (ref 65–99)
Potassium: 3.3 mmol/L — ABNORMAL LOW (ref 3.5–5.3)
Sodium: 143 mmol/L (ref 135–146)
Total Bilirubin: 0.5 mg/dL (ref 0.2–1.2)
Total Protein: 7.5 g/dL (ref 6.1–8.1)

## 2022-12-09 LAB — CBC WITH DIFFERENTIAL/PLATELET
Absolute Monocytes: 1210 {cells}/uL — ABNORMAL HIGH (ref 200–950)
Basophils Absolute: 40 {cells}/uL (ref 0–200)
Basophils Relative: 0.4 %
Eosinophils Absolute: 100 {cells}/uL (ref 15–500)
Eosinophils Relative: 1 %
HCT: 43.9 % (ref 38.5–50.0)
Hemoglobin: 14.8 g/dL (ref 13.2–17.1)
Lymphs Abs: 3210 {cells}/uL (ref 850–3900)
MCH: 31.9 pg (ref 27.0–33.0)
MCHC: 33.7 g/dL (ref 32.0–36.0)
MCV: 94.6 fL (ref 80.0–100.0)
MPV: 12.6 fL — ABNORMAL HIGH (ref 7.5–12.5)
Monocytes Relative: 12.1 %
Neutro Abs: 5440 {cells}/uL (ref 1500–7800)
Neutrophils Relative %: 54.4 %
Platelets: 232 10*3/uL (ref 140–400)
RBC: 4.64 10*6/uL (ref 4.20–5.80)
RDW: 12.6 % (ref 11.0–15.0)
Total Lymphocyte: 32.1 %
WBC: 10 10*3/uL (ref 3.8–10.8)

## 2022-12-09 LAB — LIPID PANEL
Cholesterol: 159 mg/dL (ref <200)
HDL: 33 mg/dL — ABNORMAL LOW (ref >=40)
Non-HDL Cholesterol (Calc): 126 mg/dL (ref <130)
Total CHOL/HDL Ratio: 4.8 (calc) (ref <5.0)
Triglycerides: 478 mg/dL — ABNORMAL HIGH (ref <150)

## 2022-12-09 LAB — HEMOGLOBIN A1C
Hgb A1c MFr Bld: 6.4 %{Hb} — ABNORMAL HIGH (ref <5.7)
Mean Plasma Glucose: 137 mg/dL
eAG (mmol/L): 7.6 mmol/L

## 2022-12-09 LAB — TSH: TSH: 0.75 m[IU]/L (ref 0.40–4.50)

## 2022-12-09 LAB — VITAMIN D 25 HYDROXY (VIT D DEFICIENCY, FRACTURES): Vit D, 25-Hydroxy: 48 ng/mL (ref 30–100)

## 2022-12-12 ENCOUNTER — Other Ambulatory Visit: Payer: Self-pay | Admitting: Family Medicine

## 2022-12-12 DIAGNOSIS — E876 Hypokalemia: Secondary | ICD-10-CM

## 2022-12-12 DIAGNOSIS — G4733 Obstructive sleep apnea (adult) (pediatric): Secondary | ICD-10-CM | POA: Diagnosis not present

## 2022-12-12 MED ORDER — POTASSIUM CHLORIDE CRYS ER 20 MEQ PO TBCR
20.0000 meq | EXTENDED_RELEASE_TABLET | Freq: Every day | ORAL | 0 refills | Status: DC
Start: 2022-12-12 — End: 2023-04-11

## 2022-12-21 ENCOUNTER — Ambulatory Visit: Payer: BC Managed Care – PPO | Admitting: Adult Health

## 2022-12-21 ENCOUNTER — Encounter: Payer: Self-pay | Admitting: Adult Health

## 2022-12-21 VITALS — BP 126/84 | HR 83 | Ht 71.0 in | Wt 270.6 lb

## 2022-12-21 DIAGNOSIS — G4733 Obstructive sleep apnea (adult) (pediatric): Secondary | ICD-10-CM | POA: Diagnosis not present

## 2022-12-21 NOTE — Patient Instructions (Signed)
Continue using CPAP nightly and greater than 4 hours each night Mask refitting ordered Consider CPAP pillow If your symptoms worsen or you develop new symptoms please let us know.

## 2022-12-21 NOTE — Progress Notes (Signed)
PATIENT: Samuel Valencia DOB: 08-28-1975  REASON FOR VISIT: follow up HISTORY FROM: patient PRIMARY NEUROLOGIST: Dr. Frances Furbish  Chief Complaint  Patient presents with   Follow-up    Pt in 19 Pt here for CPAP f/u  Pt states had surgery (excision of tonsil papilloma)  Pt states after surgery mask is hard to tolerate at night      HISTORY OF PRESENT ILLNESS: Today 12/21/22:  Samuel Valencia is a 47 y.o. Valencia with a history of OSA on CPAP. Returns today for follow-up.  Reports that he had excision of tonsil papilloma back in May.  Since then he has been struggling using his mask.  He states that he is a side sleeper and that causes his mask to leak.  His download is below       11/1/23[name] is a 47 year old Valencia with a history of obstructive sleep apnea on BiPAP.  He returns today for follow-up.  He reports there is some gaps in his usage.  He has tried to put the machine on nightly.  Reports that h Works 2 full time job. Reports that his sleep is fragmented due to that. Reports that he can tell a difference when he uses it. Reports that he has had to get adjusted to using the machine as he moves a lot in his sleep. Reports fatigue is better.  He states that several years ago he was diagnosed with a wart on his tonsils.  He is wondering if this plays any factor in his sleep apnea.  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  I have not seen any notes from ENT with details regarding his tonsils.    REVIEW OF SYSTEMS: Out of a complete 14 system review of symptoms, the patient complains only of the following symptoms, and all other reviewed systems are negative.  FSS ESS  ALLERGIES: No Known Allergies  HOME MEDICATIONS: Outpatient Medications Prior to Visit  Medication Sig Dispense Refill   amLODipine (NORVASC) 5 MG tablet Take 1 tablet (5 mg total) by mouth daily. 90 tablet 3   metFORMIN (GLUCOPHAGE) 500 MG tablet Take 1 tablet (500 mg total)  by mouth daily with breakfast. 90 tablet 3   potassium chloride SA (KLOR-CON M) 20 MEQ tablet Take 1 tablet (20 mEq total) by mouth daily. 4 tablet 0   No facility-administered medications prior to visit.    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   Sleep apnea     PAST SURGICAL HISTORY: Past Surgical History:  Procedure Laterality Date   NASOPHARYNGEAL BIOPSY Bilateral 08/09/2022   Procedure: ENDOSCOPIC TRANSNASAL BIOPSY OF ADENOID;  Surgeon: Scarlette Ar, MD;  Location: [facility];  Service: ENT;  Laterality: Bilateral;   TONSILLECTOMY Right 08/09/2022   Procedure: EXCISION OF TONSIL PAPILLOMA;  Surgeon: Scarlette Ar, MD;  Location: [facility];  Service: ENT;  Laterality: Right;   VASECTOMY      FAMILY HISTORY: Family History  Problem Relation Age of Onset   Hypertension Mother    Hypertension Father    Hyperlipidemia Father    Diabetes Father    Colon polyps Neg Hx    Colon cancer Neg Hx    Esophageal cancer Neg Hx    Stomach cancer Neg Hx    Rectal cancer Neg Hx    Neuropathy  Neg Hx    Headache Neg Hx    Sleep apnea Neg Hx     SOCIAL HISTORY: Social History   Socioeconomic History   Marital status: Married    Spouse name: Not on file   Number of children: Not on file   Years of education: Not on file   Highest education level: 12th grade  Occupational History   Not on file  Tobacco Use   Smoking status: Never   Smokeless tobacco: Never  Vaping Use   Vaping status: Never Used  Substance and Sexual Activity   Alcohol use: No   Drug use: No   Sexual activity: Not on file  Other Topics Concern   Not on file  Social History Narrative   Caffiene soda 16 oz.    Working 2 FT (security)   Social Determinants of Health   Financial Resource Strain: Patient Declined (12/04/2022)   Overall Financial  Resource Strain (CARDIA)    Difficulty of Paying Living Expenses: Patient declined  Food Insecurity: Patient Declined (12/04/2022)   Hunger Vital Sign    Worried About Running Out of Food in the Last Year: Patient declined    Ran Out of Food in the Last Year: Patient declined  Transportation Needs: Patient Declined (12/04/2022)   PRAPARE - Administrator, Civil Service (Medical): Patient declined    Lack of Transportation (Non-Medical): Patient declined  Physical Activity: Insufficiently Active (12/04/2022)   Exercise Vital Sign    Days of Exercise per Week: 3 days    Minutes of Exercise per Session: 30 min  Stress: No Stress Concern Present (12/04/2022)   [employer] of Occupational Health - Occupational Stress Questionnaire    Feeling of Stress : Only a little  Social Connections: Unknown (12/04/2022)   Social Connection and Isolation Panel [NHANES]    Frequency of Communication with Friends and Family: Patient declined    Frequency of Social Gatherings with Friends and Family: Patient declined    Attends Religious Services: Patient declined    Database administrator or Organizations: No    Attends Engineer, structural: Not on file    Marital Status: Patient declined  Intimate Partner Violence: Not At Risk (08/01/2022)   Humiliation, Afraid, Rape, and Kick questionnaire    Fear of Current or Ex-Partner: No    Emotionally Abused: No    Physically Abused: No    Sexually Abused: No      PHYSICAL EXAM  Vitals:   12/21/22 1255  BP: 126/84  Pulse: 83  Weight: 270 lb 9.6 oz (122.7 kg)  Height: 5\' 11"  (1.803 m)   Body mass index is 37.74 kg/m.  Generalized: Well developed, in no acute distress  Chest: Lungs clear to auscultation bilaterally  Neurological examination  Mentation: Alert oriented to time, place, history taking. Follows all commands speech and language fluent Cranial nerve II-XII: Extraocular movements were full, visual field were full on  confrontational test Head turning and shoulder shrug  were normal and symmetric. Gait and station: Gait is normal.    DIAGNOSTIC DATA (LABS, IMAGING, TESTING) - I reviewed patient records, labs, notes, testing and imaging myself where available.  Lab Results  Component Value Date   WBC 10.0 12/08/2022   HGB 14.8 12/08/2022   HCT 43.9 12/08/2022   MCV 94.6 12/08/2022   PLT 232 12/08/2022      Component Value Date/Time   NA 143 12/08/2022 1619   K 3.3 (L) 12/08/2022 1619   CL  101 12/08/2022 1619   CO2 32 12/08/2022 1619   GLUCOSE 139 (H) 12/08/2022 1619   BUN 16 12/08/2022 1619   CREATININE 1.02 12/08/2022 1619   CALCIUM 9.6 12/08/2022 1619   PROT 7.5 12/08/2022 1619   PROT 7.6 06/05/2022 1313   ALBUMIN 4.3 08/01/2022 1139   AST 15 12/08/2022 1619   AST 18 08/01/2022 1139   ALT 21 12/08/2022 1619   ALT 22 08/01/2022 1139   ALKPHOS 48 08/01/2022 1139   BILITOT 0.5 12/08/2022 1619   BILITOT 0.7 08/01/2022 1139   GFRNONAA >60 08/03/2022 0900   GFRNONAA >60 08/01/2022 1139   GFRAA 125 11/02/2006 0839   Lab Results  Component Value Date   CHOL 159 12/08/2022   HDL 33 (L) 12/08/2022   LDLCALC  12/08/2022     Comment:     . LDL cholesterol not calculated. Triglyceride levels greater than 400 mg/dL invalidate calculated LDL results. . Reference range: <100 . Desirable range <100 mg/dL for primary prevention;   <70 mg/dL for patients with CHD or diabetic patients  with > or = 2 CHD risk factors. Marland Kitchen LDL-C is now calculated using the Martin-Hopkins  calculation, which is a validated novel method providing  better accuracy than the Friedewald equation in the  estimation of LDL-C.  Horald Pollen et al. Lenox Ahr. 1610;960(45): 2061-2068  (http://education.[facility].com/faq/FAQ164)    LDLDIRECT 75.0 08/10/2021   TRIG 478 (H) 12/08/2022   CHOLHDL 4.8 12/08/2022   Lab Results  Component Value Date   HGBA1C 6.4 (H) 12/08/2022   Lab Results  Component Value Date    VITAMINB12 519 06/05/2022   Lab Results  Component Value Date   TSH 0.75 12/08/2022      ASSESSMENT AND PLAN Samuel Valencia  has a past medical history of Diabetes (HCC), Hypertension, Laryngopharyngeal reflux (LPR), Papilloma of tonsil (07/04/2022), and Sleep apnea. here with:  OSA on BiPAP  - BiPAP compliance suboptimal  - Good treatment of AHI  - Mask refitting and recommended a CPAP pillow - Encourage patient to use BiPAP nightly and > 4 hours each night - F/U in 6 months or sooner if needed   Butch Penny, MSN, NP-C 12/21/2022, 1:11 PM [facility] [address], [address] [place], [place] [address] [phone]

## 2023-01-04 DIAGNOSIS — G5603 Carpal tunnel syndrome, bilateral upper limbs: Secondary | ICD-10-CM | POA: Diagnosis not present

## 2023-01-04 DIAGNOSIS — G5621 Lesion of ulnar nerve, right upper limb: Secondary | ICD-10-CM | POA: Diagnosis not present

## 2023-01-12 DIAGNOSIS — G4733 Obstructive sleep apnea (adult) (pediatric): Secondary | ICD-10-CM | POA: Diagnosis not present

## 2023-02-11 DIAGNOSIS — G4733 Obstructive sleep apnea (adult) (pediatric): Secondary | ICD-10-CM | POA: Diagnosis not present

## 2023-03-14 DIAGNOSIS — G4733 Obstructive sleep apnea (adult) (pediatric): Secondary | ICD-10-CM | POA: Diagnosis not present

## 2023-04-11 ENCOUNTER — Encounter: Payer: Self-pay | Admitting: Family Medicine

## 2023-04-11 ENCOUNTER — Ambulatory Visit: Payer: BC Managed Care – PPO | Admitting: Family Medicine

## 2023-04-11 VITALS — BP 132/80 | HR 80 | Temp 98.6°F | Ht 71.0 in | Wt 265.8 lb

## 2023-04-11 DIAGNOSIS — E118 Type 2 diabetes mellitus with unspecified complications: Secondary | ICD-10-CM

## 2023-04-11 DIAGNOSIS — E781 Pure hyperglyceridemia: Secondary | ICD-10-CM

## 2023-04-11 DIAGNOSIS — Z7984 Long term (current) use of oral hypoglycemic drugs: Secondary | ICD-10-CM | POA: Diagnosis not present

## 2023-04-11 DIAGNOSIS — E66812 Obesity, class 2: Secondary | ICD-10-CM

## 2023-04-11 DIAGNOSIS — Z6837 Body mass index (BMI) 37.0-37.9, adult: Secondary | ICD-10-CM

## 2023-04-11 DIAGNOSIS — I10 Essential (primary) hypertension: Secondary | ICD-10-CM

## 2023-04-11 NOTE — Patient Instructions (Signed)
The new referral for lipid clinic was placed.  They will call you about setting up an appointment.  You should strongly consider starting medication to help with your triglyceride level.  It looks like the referral for the hand surgeon is still pending?  We will reach out to our referral coordinator to see if a new one is needed.  Otherwise you can contact the hand surgery office to set up a new appointment.
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
## 2023-07-21 ENCOUNTER — Other Ambulatory Visit: Payer: Self-pay | Admitting: Family Medicine

## 2023-07-21 DIAGNOSIS — I10 Essential (primary) hypertension: Secondary | ICD-10-CM

## 2023-07-31 ENCOUNTER — Other Ambulatory Visit: Payer: Self-pay | Admitting: Family Medicine

## 2023-07-31 DIAGNOSIS — E118 Type 2 diabetes mellitus with unspecified complications: Secondary | ICD-10-CM

## 2023-08-10 DIAGNOSIS — G4733 Obstructive sleep apnea (adult) (pediatric): Secondary | ICD-10-CM | POA: Diagnosis not present

## 2023-08-16 ENCOUNTER — Ambulatory Visit (INDEPENDENT_AMBULATORY_CARE_PROVIDER_SITE_OTHER): Admitting: Family Medicine

## 2023-08-16 ENCOUNTER — Encounter: Payer: Self-pay | Admitting: Family Medicine

## 2023-08-16 VITALS — BP 144/98 | HR 82 | Temp 98.9°F | Ht 71.0 in | Wt 263.0 lb

## 2023-08-16 DIAGNOSIS — E66812 Obesity, class 2: Secondary | ICD-10-CM | POA: Diagnosis not present

## 2023-08-16 DIAGNOSIS — Z6836 Body mass index (BMI) 36.0-36.9, adult: Secondary | ICD-10-CM | POA: Diagnosis not present

## 2023-08-16 DIAGNOSIS — I10 Essential (primary) hypertension: Secondary | ICD-10-CM | POA: Diagnosis not present

## 2023-08-16 DIAGNOSIS — Z0000 Encounter for general adult medical examination without abnormal findings: Secondary | ICD-10-CM | POA: Diagnosis not present

## 2023-08-16 DIAGNOSIS — E781 Pure hyperglyceridemia: Secondary | ICD-10-CM

## 2023-08-16 DIAGNOSIS — E118 Type 2 diabetes mellitus with unspecified complications: Secondary | ICD-10-CM

## 2023-08-16 DIAGNOSIS — Z125 Encounter for screening for malignant neoplasm of prostate: Secondary | ICD-10-CM | POA: Diagnosis not present

## 2023-08-16 DIAGNOSIS — Z7984 Long term (current) use of oral hypoglycemic drugs: Secondary | ICD-10-CM

## 2023-08-16 LAB — CBC WITH DIFFERENTIAL/PLATELET
Basophils Absolute: 0 10*3/uL (ref 0.0–0.1)
Basophils Relative: 0.4 % (ref 0.0–3.0)
Eosinophils Absolute: 0.1 10*3/uL (ref 0.0–0.7)
Eosinophils Relative: 1.1 % (ref 0.0–5.0)
HCT: 43 % (ref 39.0–52.0)
Hemoglobin: 14.7 g/dL (ref 13.0–17.0)
Lymphocytes Relative: 35.5 % (ref 12.0–46.0)
Lymphs Abs: 1.9 10*3/uL (ref 0.7–4.0)
MCHC: 34.2 g/dL (ref 30.0–36.0)
MCV: 93.3 fl (ref 78.0–100.0)
Monocytes Absolute: 0.8 10*3/uL (ref 0.1–1.0)
Monocytes Relative: 13.8 % — ABNORMAL HIGH (ref 3.0–12.0)
Neutro Abs: 2.7 10*3/uL (ref 1.4–7.7)
Neutrophils Relative %: 49.2 % (ref 43.0–77.0)
Platelets: 225 10*3/uL (ref 150.0–400.0)
RBC: 4.61 Mil/uL (ref 4.22–5.81)
RDW: 12.5 % (ref 11.5–15.5)
WBC: 5.5 10*3/uL (ref 4.0–10.5)

## 2023-08-16 LAB — PSA: PSA: 0.53 ng/mL (ref 0.10–4.00)

## 2023-08-16 LAB — TSH: TSH: 0.68 u[IU]/mL (ref 0.35–5.50)

## 2023-08-16 LAB — HEMOGLOBIN A1C: Hgb A1c MFr Bld: 6.3 % (ref 4.6–6.5)

## 2023-08-16 IMAGING — CT CT CERVICAL SPINE W/O CM
3 of 4 series · 13 of 33 positions shown, 16 images · non-contrast
Comparison: None.

CLINICAL DATA: Trauma with tenderness



[Series 4: c_spine 2.0 st · axial · 0.31mm/px · z∈[-187,-67]mm · 5 of 92 slices shown, 7 images]
[im 16/92  soft-tissue]
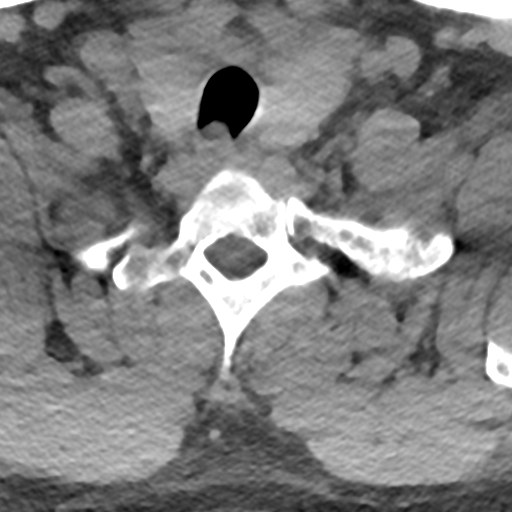
[im 16/92  bone]
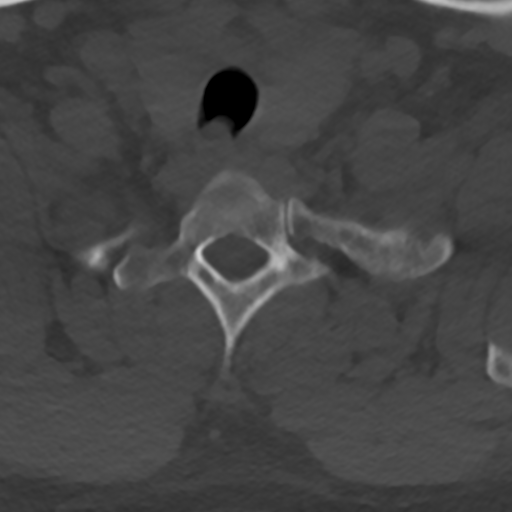
[im 31/92  bone]
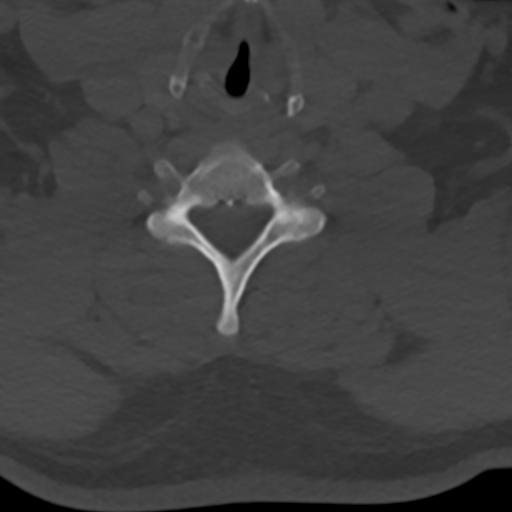
[im 46/92  bone]
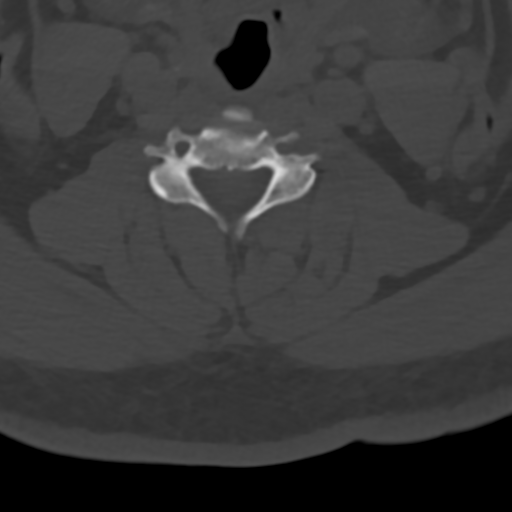
[im 61/92  bone]
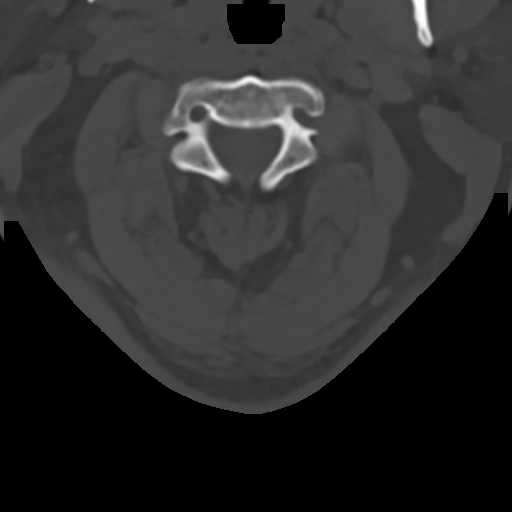
[im 76/92  soft-tissue]
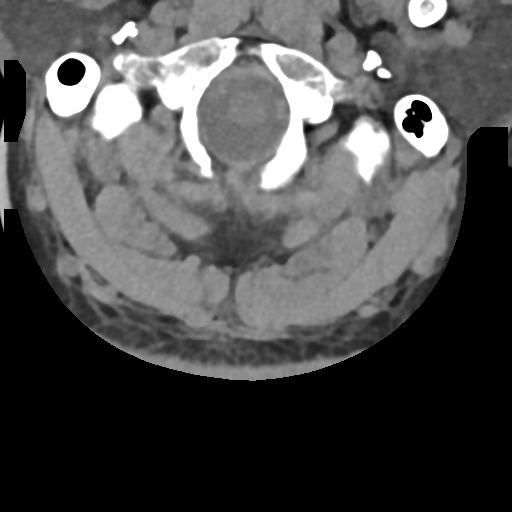
[im 76/92  bone]
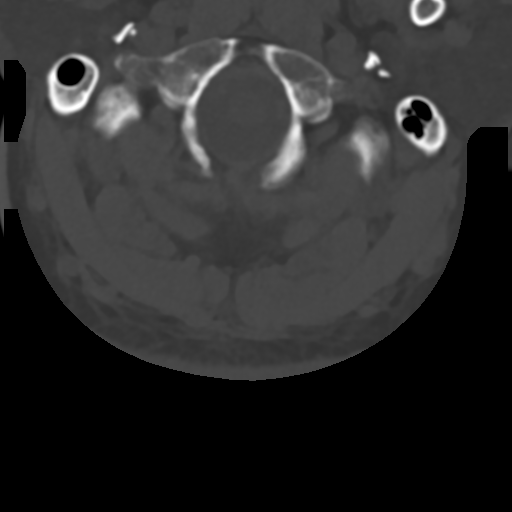

[Series 10: c_spine 2.0 sag bone · sagittal · 0.32mm/px · 5 of 61 slices shown, 6 images]
[im 21/61  bone]
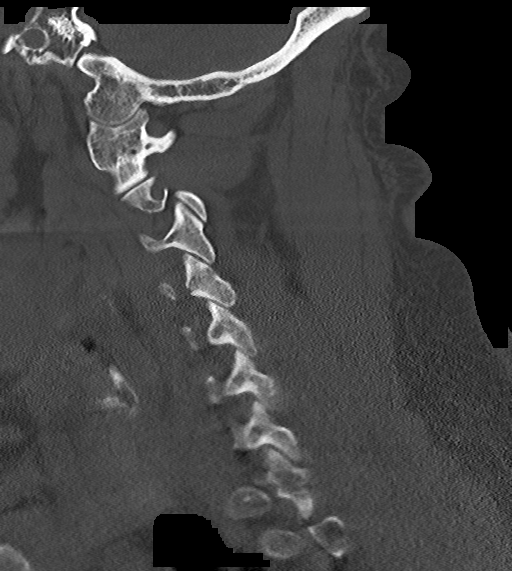
[im 26/61  bone]
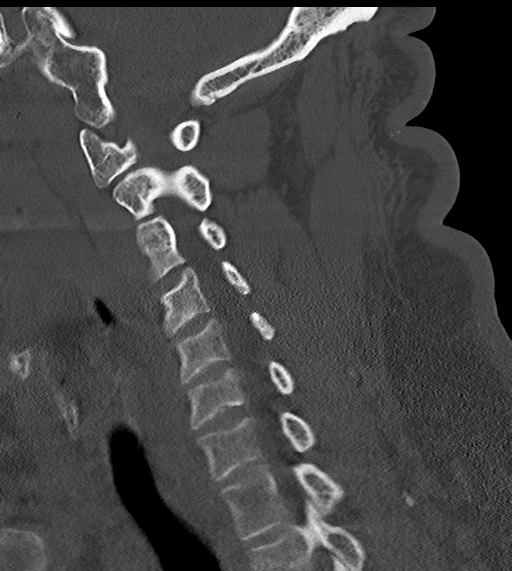
[im 31/61  soft-tissue]
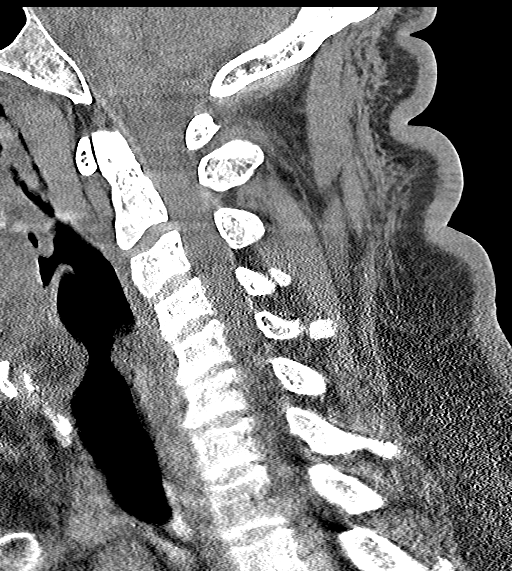
[im 31/61  bone]
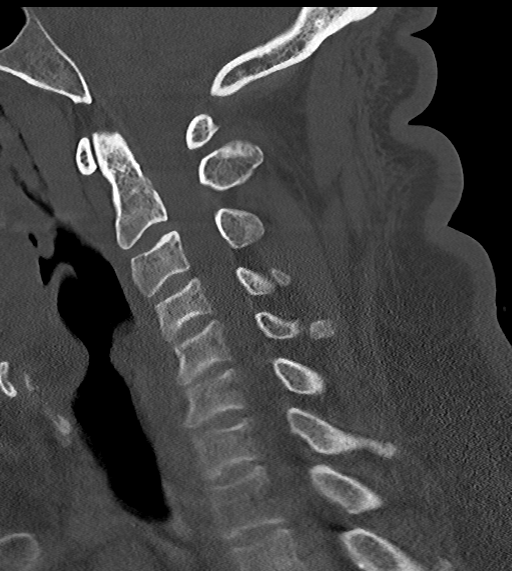
[im 36/61  bone]
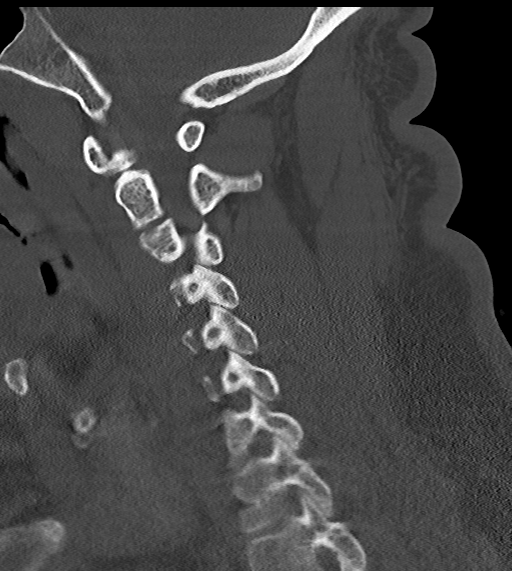
[im 41/61  bone]
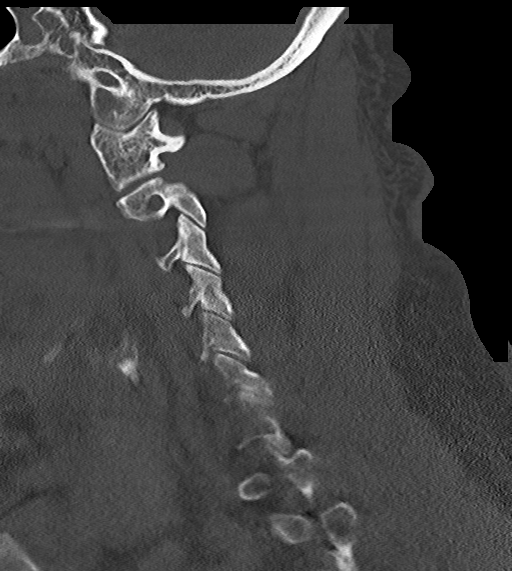

[Series 11: c_spine 2.0 cor bone · coronal · 0.27mm/px · 3 of 82 slices shown]
[im 17/82  bone]
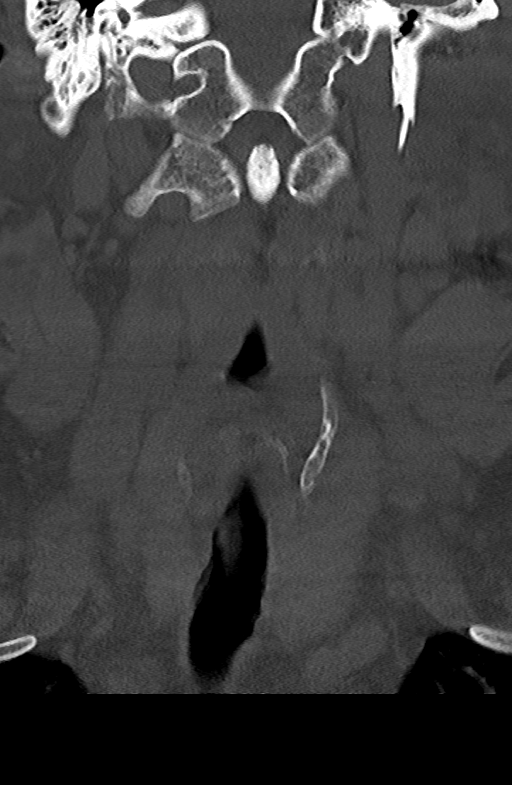
[im 33/82  bone]
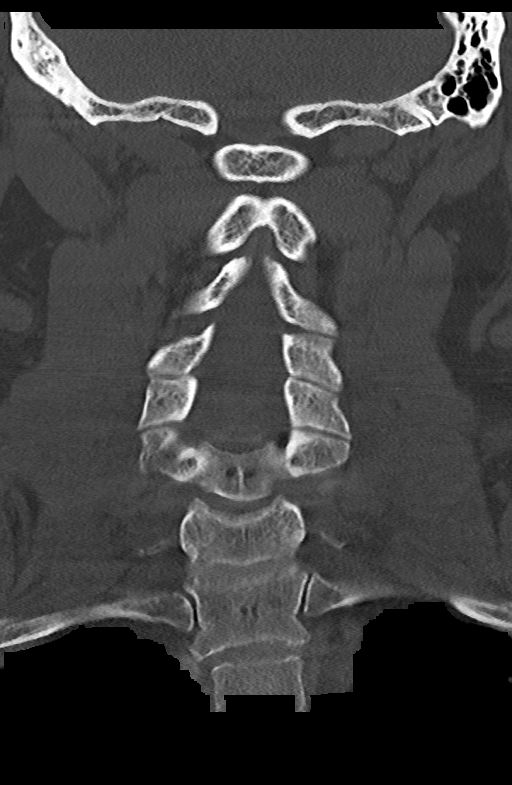
[im 49/82  bone]
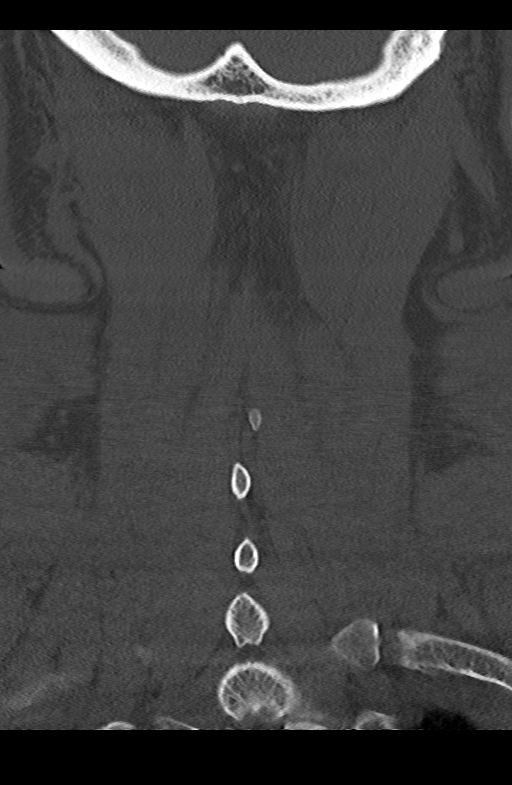

[13 of 33 positions shown; findings below may reference images not displayed]

FINDINGS: Alignment: Straightening of the cervical spine. No subluxation.
Facet alignment within normal limits.

Skull base and vertebrae: No acute fracture. No primary bone lesion
or focal pathologic process.

Soft tissues and spinal canal: No canal stenosis. Mild edema within
the subcutaneous soft tissues of the posterior cervical region.

Disc levels: Mild disc space narrowing and degenerative change
C5-C6.

Upper chest: Negative.

Other: None
IMPRESSION: Straightening of the cervical spine.  No acute osseous abnormality.

## 2023-08-16 NOTE — Progress Notes (Signed)
 Established Patient Office Visit   Subjective  Patient ID: Samuel Valencia, male    DOB: April 14, 1975  Age: 48 y.o. MRN: [id number]  Chief Complaint  Patient presents with   Annual Exam    Patient is a 48 year old male seen for CPE and follow-up.  Patient did not take BP meds today.  He has been monitoring his blood pressure regularly at work.  Notes improvement in readings.  Does not recall actual numbers.  He is actively working on lifestyle changes, including setting boundaries at work to reduce stress and taking time off for self-care. He is making dietary changes, such as reducing meat intake and avoiding sugary, salty, and high-carbohydrate foods. He is also incorporating supplements like black seed oil, M[place], and cayenne pepper to aid in blood circulation. He mentions feeling improvements in his overall health and is focused on losing weight and managing inflammation.  Pt's father passed over the weekend.  He had a history of cardiovascular disease and diabetes which helps motivate patient to improve his health.  No current issues with asthma and he is not concerned about his blood pressure at this time. He is curious about his A1c and cholesterol levels.    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 07/04/2022   Lesion of nasopharynx 07/04/2022   OSA on CPAP 07/04/2022   Papilloma of tonsil 07/04/2022   Laryngopharyngeal reflux (LPR) 05/21/2017   Papilloma of uvula 05/21/2017   Pharyngoesophageal dysphagia 05/21/2017   Routine general medical examination at a health care facility 05/09/2017   Enlarged tonsils 05/09/2017   Benign essential hypertension 11/06/2013   Past Medical History:  Diagnosis Date   Diabetes (HCC)    Hypertension    Laryngopharyngeal reflux (LPR)     Papilloma of tonsil 07/04/2022   Sleep apnea    Past Surgical History:  Procedure Laterality Date   NASOPHARYNGEAL BIOPSY Bilateral 08/09/2022   Procedure: ENDOSCOPIC TRANSNASAL BIOPSY OF ADENOID;  Surgeon: Rush Coupe, MD;  Location: [facility];  Service: ENT;  Laterality: Bilateral;   TONSILLECTOMY Right 08/09/2022   Procedure: EXCISION OF TONSIL PAPILLOMA;  Surgeon: Rush Coupe, MD;  Location: [facility];  Service: ENT;  Laterality: Right;   VASECTOMY     Social History   Tobacco Use   Smoking status: Never   Smokeless tobacco: Never  Vaping Use   Vaping status: Never Used  Substance Use Topics   Alcohol use: No   Drug use: No   Family History  Problem Relation Age of Onset   Hypertension Mother    Hypertension Father    Hyperlipidemia Father    Diabetes Father    Colon polyps Neg Hx    Colon cancer Neg Hx    Esophageal cancer Neg Hx    Stomach cancer Neg Hx    Rectal cancer Neg Hx    Neuropathy Neg Hx    Headache Neg Hx    Sleep apnea Neg Hx    No Known Allergies  ROS Negative unless stated above    Objective:      BP (!) 144/98 (BP Location: Left Arm, Patient Position: Sitting, Cuff Size: Normal) Comment: Bp meds not taken  Pulse 82   Temp 98.9 F (37.2 C) (Oral)   Ht 5\' 11"  (1.803 m)  Wt 263 lb (119.3 kg)   SpO2 98%   BMI 36.68 kg/m  BP Readings from Last 3 Encounters:  08/16/23 (!) 144/98  04/11/23 132/80  12/21/22 126/84   Wt Readings from Last 3 Encounters:  08/16/23 263 lb (119.3 kg)  04/11/23 265 lb 12.8 oz (120.6 kg)  12/21/22 270 lb 9.6 oz (122.7 kg)      Physical Exam Constitutional:      Appearance: Normal appearance.  HENT:     Head: Normocephalic and atraumatic.     Right Ear: Tympanic membrane, ear canal and external ear normal.     Left Ear: Tympanic membrane, ear canal and external ear normal.     Nose: Nose normal.     Mouth/Throat:     Mouth: Mucous membranes are moist.     Pharynx:  No oropharyngeal exudate or posterior oropharyngeal erythema.  Eyes:     General: No scleral icterus.    Extraocular Movements: Extraocular movements intact.     Conjunctiva/sclera: Conjunctivae normal.     Pupils: Pupils are equal, round, and reactive to light.  Neck:     Thyroid : No thyromegaly.  Cardiovascular:     Rate and Rhythm: Normal rate and regular rhythm.     Pulses: Normal pulses.     Heart sounds: Normal heart sounds. No murmur heard.    No friction rub.  Pulmonary:     Effort: Pulmonary effort is normal.     Breath sounds: Normal breath sounds. No wheezing, rhonchi or rales.  Abdominal:     General: Bowel sounds are normal.     Palpations: Abdomen is soft.     Tenderness: There is no abdominal tenderness.  Musculoskeletal:        General: No deformity. Normal range of motion.  Lymphadenopathy:     Cervical: No cervical adenopathy.  Skin:    General: Skin is warm and dry.     Findings: No lesion.  Neurological:     General: No focal deficit present.     Mental Status: He is alert and oriented to person, place, and time.  Psychiatric:        Mood and Affect: Mood normal.        Thought Content: Thought content normal.        04/11/2023    4:33 PM 12/08/2022    4:07 PM 08/01/2022   11:06 AM  Depression screen PHQ 2/9  Decreased Interest 0 0 0  Down, Depressed, Hopeless  0 0  PHQ - 2 Score 0 0 0  Altered sleeping 0 0   Tired, decreased energy 0 0   Change in appetite 0 0   Feeling bad or failure about yourself  0 0   Trouble concentrating 0 0   Moving slowly or fidgety/restless 0 0   Suicidal thoughts 0 0   PHQ-9 Score 0 0   Difficult doing work/chores Not difficult at all Not difficult at all       04/11/2023    4:33 PM 12/08/2022    4:07 PM  GAD 7 : Generalized Anxiety Score  Nervous, Anxious, on Edge 0 0  Control/stop worrying 0 0  Worry too much - different things 0 0  Trouble relaxing 0 0  Restless 0 0  Easily annoyed or irritable 0 0   Afraid - awful might happen 0 0  Total GAD 7 Score 0 0  Anxiety Difficulty Not difficult at all Not difficult at all    No results found  for any visits on 08/16/23.    Assessment & Plan:   Well adult exam -     CBC with Differential/Platelet; Future -     Comprehensive metabolic panel with GFR; Future -     Hemoglobin A1c; Future -     Lipid panel; Future -     TSH; Future  Essential hypertension -     CBC with Differential/Platelet; Future -     Comprehensive metabolic panel with GFR; Future -     Lipid panel; Future -     TSH; Future  Controlled type 2 diabetes mellitus with complication, without long-term current use of insulin (HCC) -     Hemoglobin A1c; Future -     Lipid panel; Future -     Microalbumin / creatinine urine ratio  Class 2 severe obesity with serious comorbidity and body mass index (BMI) of 36.0 to 36.9 in adult, unspecified obesity type (HCC) -     VITAMIN D  25 Hydroxy (Vit-D Deficiency, Fractures); Future  Screening for prostate cancer -     PSA; Future  Pure hypertriglyceridemia -     Lipid panel; Future  Age-appropriate health screenings discussed.  Will obtain labs.  Colonoscopy up-to-date.  BP improving.  Recheck as elevated at this visit due to patient not taking medication.  Advised to take Norvasc  5 mg when returns home.  Continue lifestyle modifications.  Last hemoglobin A1c 6.4% on 12/08/2022.  Continue metformin  500 mg daily.  Pt schedule eye exam.  Foot exam at next [facility].  Return in about 6 months (around 02/15/2024).   Viola Greulich, MD

## 2023-08-17 ENCOUNTER — Other Ambulatory Visit: Payer: Self-pay | Admitting: Family Medicine

## 2023-08-17 ENCOUNTER — Ambulatory Visit: Payer: Self-pay | Admitting: Family Medicine

## 2023-08-17 DIAGNOSIS — E876 Hypokalemia: Secondary | ICD-10-CM

## 2023-08-17 DIAGNOSIS — E559 Vitamin D deficiency, unspecified: Secondary | ICD-10-CM

## 2023-08-17 LAB — VITAMIN D 25 HYDROXY (VIT D DEFICIENCY, FRACTURES): VITD: 22.23 ng/mL — ABNORMAL LOW (ref 30.00–100.00)

## 2023-08-17 LAB — COMPREHENSIVE METABOLIC PANEL WITH GFR
ALT: 20 U/L (ref 0–53)
AST: 16 U/L (ref 0–37)
Albumin: 4.4 g/dL (ref 3.5–5.2)
Alkaline Phosphatase: 58 U/L (ref 39–117)
BUN: 12 mg/dL (ref 6–23)
CO2: 27 meq/L (ref 19–32)
Calcium: 9.4 mg/dL (ref 8.4–10.5)
Chloride: 103 meq/L (ref 96–112)
Creatinine, Ser: 1.04 mg/dL (ref 0.40–1.50)
GFR: 83.89 mL/min (ref >60.00)
Glucose, Bld: 128 mg/dL — ABNORMAL HIGH (ref 70–99)
Potassium: 3.2 meq/L — ABNORMAL LOW (ref 3.5–5.1)
Sodium: 143 meq/L (ref 135–145)
Total Bilirubin: 0.7 mg/dL (ref 0.2–1.2)
Total Protein: 7.4 g/dL (ref 6.0–8.3)

## 2023-08-17 LAB — LIPID PANEL
Cholesterol: 148 mg/dL (ref 0–200)
HDL: 31.3 mg/dL — ABNORMAL LOW (ref >39.00)
LDL Cholesterol: 76 mg/dL (ref 0–99)
NonHDL: 116.67
Total CHOL/HDL Ratio: 5
Triglycerides: 201 mg/dL — ABNORMAL HIGH (ref 0.0–149.0)
VLDL: 40.2 mg/dL — ABNORMAL HIGH (ref 0.0–40.0)

## 2023-08-17 LAB — MICROALBUMIN / CREATININE URINE RATIO
Creatinine,U: 193.7 mg/dL
Microalb Creat Ratio: 26.6 mg/g (ref 0.0–30.0)
Microalb, Ur: 5.2 mg/dL — ABNORMAL HIGH (ref 0.0–1.9)

## 2023-08-17 MED ORDER — POTASSIUM CHLORIDE CRYS ER 20 MEQ PO TBCR
20.0000 meq | EXTENDED_RELEASE_TABLET | Freq: Every day | ORAL | 0 refills | Status: AC
Start: 2023-08-17 — End: 2023-10-10

## 2023-08-17 MED ORDER — VITAMIN D (ERGOCALCIFEROL) 1.25 MG (50000 UNIT) PO CAPS: 50000.0000 [IU] | ORAL_CAPSULE | ORAL | 0 refills | Status: DC

## 2023-09-06 ENCOUNTER — Other Ambulatory Visit: Payer: Self-pay | Admitting: Family Medicine

## 2023-09-06 DIAGNOSIS — I10 Essential (primary) hypertension: Secondary | ICD-10-CM

## 2023-09-10 DIAGNOSIS — G4733 Obstructive sleep apnea (adult) (pediatric): Secondary | ICD-10-CM | POA: Diagnosis not present

## 2023-09-26 ENCOUNTER — Other Ambulatory Visit: Payer: Self-pay | Admitting: Family Medicine

## 2023-09-26 DIAGNOSIS — E876 Hypokalemia: Secondary | ICD-10-CM

## 2023-10-10 ENCOUNTER — Ambulatory Visit (HOSPITAL_BASED_OUTPATIENT_CLINIC_OR_DEPARTMENT_OTHER): Admitting: Internal Medicine

## 2023-10-10 VITALS — BP 134/84 | Ht 71.0 in | Wt 267.8 lb

## 2023-10-10 DIAGNOSIS — E669 Obesity, unspecified: Secondary | ICD-10-CM | POA: Diagnosis not present

## 2023-10-10 DIAGNOSIS — E119 Type 2 diabetes mellitus without complications: Secondary | ICD-10-CM | POA: Diagnosis not present

## 2023-10-10 DIAGNOSIS — E781 Pure hyperglyceridemia: Secondary | ICD-10-CM

## 2023-10-10 DIAGNOSIS — G4733 Obstructive sleep apnea (adult) (pediatric): Secondary | ICD-10-CM

## 2023-10-10 NOTE — Patient Instructions (Signed)
 Medication Instructions:  Your physician recommends that you continue on your current medications as directed. Please refer to the Current Medication list given to you today.  *If you need a refill on your cardiac medications before your next appointment, please call your pharmacy*  Testing/Procedures: Dr. Mona has ordered a CT coronary calcium score.   Test locations:  [facility] at [facility]  [facility] at [facility]  This is $99 out of pocket.   Coronary CalciumScan A coronary calcium scan is an imaging test used to look for deposits of calcium and other fatty materials (plaques) in the inner lining of the blood vessels of the heart (coronary arteries). These deposits of calcium and plaques can partly clog and narrow the coronary arteries without producing any symptoms or warning signs. This puts a person at risk for a heart attack. This test can detect these deposits before symptoms develop. Tell a health care provider about: Any allergies you have. All medicines you are taking, including vitamins, herbs, eye drops, creams, and over-the-counter medicines. Any problems you or family members have had with anesthetic medicines. Any blood disorders you have. Any surgeries you have had. Any medical conditions you have. Whether you are pregnant or may be pregnant. What are the risks? Generally, this is a safe procedure. However, problems may occur, including: Harm to a pregnant woman and her unborn baby. This test involves the use of radiation. Radiation exposure can be dangerous to a pregnant woman and her unborn baby. If you are pregnant, you generally should not have this procedure done. Slight increase in the risk of cancer. This is because of the radiation involved in the test. What happens before the procedure? No preparation is needed for this procedure. What happens  during the procedure? You will undress and remove any jewelry around your neck or chest. You will put on a hospital gown. Sticky electrodes will be placed on your chest. The electrodes will be connected to an electrocardiogram (ECG) machine to record a tracing of the electrical activity of your heart. A CT scanner will take pictures of your heart. During this time, you will be asked to lie still and hold your breath for 2-3 seconds while a picture of your heart is being taken. The procedure may vary among health care providers and hospitals. What happens after the procedure? You can get dressed. You can return to your normal activities. It is up to you to get the results of your test. Ask your health care provider, or the department that is doing the test, when your results will be ready. Summary A coronary calcium scan is an imaging test used to look for deposits of calcium and other fatty materials (plaques) in the inner lining of the blood vessels of the heart (coronary arteries). Generally, this is a safe procedure. Tell your health care provider if you are pregnant or may be pregnant. No preparation is needed for this procedure. A CT scanner will take pictures of your heart. You can return to your normal activities after the scan is done. This information is not intended to replace advice given to you by your health care provider. Make sure you discuss any questions you have with your health care provider. Document Released: 08/26/2007 Document Revised: 01/17/2016 Document Reviewed: 01/17/2016 Elsevier Interactive Patient Education  2017 [employer].   Follow-Up: At [facility], you and your health needs are our priority.  As  part of our continuing mission to provide you with exceptional heart care, our providers are all part of one team.  This team includes your primary Cardiologist (physician) and Advanced Practice Providers or APPs (Physician Assistants and Nurse  Practitioners) who all work together to provide you with the care you need, when you need it.  Your next appointment:   TBD based on your test results

## 2023-10-11 NOTE — Progress Notes (Signed)
 LIPID CLINIC CONSULT NOTE  Chief Complaint:  Manage dyslipidemia  Primary Care Physician: Samuel Clotilda SAUNDERS, MD  Primary Cardiologist:  None  HPI:  Samuel Valencia is a 48 y.o. male who is being seen today for the evaluation of dyslipidemia at the request of Samuel Clotilda SAUNDERS, MD. this is a pleasant 48 year old male kindly referred for evaluation management of dyslipidemia.  He has a history of elevated cholesterol but is not on any lipid-lowering therapies.  Recent labs showed total cholesterol 148, triglycerides 201, HDL 31 and LDL 76.  Previous triglycerides have been much more elevated up into the 400s.  He denies any history of pancreatitis.  He does report high cholesterol in his father but no known early onset heart disease.  Diet could be improved somewhat with regards to saturated fats.  Activity level is moderate.  He also has a history of diabetes, however A1c is 6.3%, and sleep apnea with suboptimal compliance to CPAP.  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   Sleep apnea     Past Surgical History:  Procedure Laterality Date   NASOPHARYNGEAL BIOPSY Bilateral 08/09/2022   Procedure: ENDOSCOPIC TRANSNASAL BIOPSY OF ADENOID;  Surgeon: Samuel Standing, MD;  Location: [facility];  Service: ENT;  Laterality: Bilateral;   TONSILLECTOMY Right 08/09/2022   Procedure: EXCISION OF TONSIL PAPILLOMA;  Surgeon: Samuel Standing, MD;  Location: [facility];  Service: ENT;  Laterality: Right;   VASECTOMY      FAMHx:  Family History  Problem Relation Age of Onset   Hypertension Mother    Hypertension Father    Hyperlipidemia Father    Diabetes Father    Colon polyps Neg Hx    Colon cancer Neg Hx    Esophageal cancer Neg Hx    Stomach cancer Neg Hx    Rectal cancer Neg Hx    Neuropathy Neg Hx    Headache Neg Hx    Sleep apnea Neg Hx      SOCHx:   reports that he has never smoked. He has never used smokeless tobacco. He reports that he does not drink alcohol and does not use drugs.  ALLERGIES:  No Known Allergies  ROS: Pertinent items noted in HPI and remainder of comprehensive ROS otherwise negative.  HOME MEDS: Current Outpatient Medications on File Prior to Visit  Medication Sig Dispense Refill   amLODipine  (NORVASC ) 5 MG tablet TAKE 1 TABLET(5 MG) BY MOUTH DAILY 90 tablet 3   metFORMIN  (GLUCOPHAGE ) 500 MG tablet TAKE 1 TABLET(500 MG) BY MOUTH DAILY WITH BREAKFAST 90 tablet 0   potassium chloride  SA (KLOR-CON  M) 20 MEQ tablet Take 1 tablet (20 mEq total) by mouth daily for 4 days. 4 tablet 0   Vitamin D , Ergocalciferol , (DRISDOL ) 1.25 MG (50000 UNIT) CAPS capsule Take 1 capsule (50,000 Units total) by mouth every 7 (seven) days. 12 capsule 0   No current facility-administered medications on file prior to visit.    LABS/IMAGING: No results found for this or any previous visit (from the past 48 hours). No results found.  LIPID PANEL:    Component Value Date/Time   CHOL 148 08/16/2023 1407   TRIG 201.0 (H) 08/16/2023 1407   HDL 31.30 (L) 08/16/2023 1407   CHOLHDL 5 08/16/2023 1407   VLDL 40.2 (H) 08/16/2023 1407   LDLCALC 76 08/16/2023 1407  LDLCALC  12/08/2022 1619     Comment:     . LDL cholesterol not calculated. Triglyceride levels greater than 400 mg/dL invalidate calculated LDL results. . Reference range: <100 . Desirable range <100 mg/dL for primary prevention;   <70 mg/dL for patients with CHD or diabetic patients  with > or = 2 CHD risk factors. Samuel Valencia LDL-C is now calculated using the Martin-Hopkins  calculation, which is a validated novel method providing  better accuracy than the Friedewald equation in the  estimation of LDL-C.  Samuel Valencia et al. SANDREA. 7986;689(80): 2061-2068  (http://education.[employer].com/faq/FAQ164)    LDLDIRECT 75.0 08/10/2021 0914    No results found  for: LIPOA   WEIGHTS: Wt Readings from Last 3 Encounters:  10/10/23 267 lb 12.8 oz (121.5 kg)  08/16/23 263 lb (119.3 kg)  04/11/23 265 lb 12.8 oz (120.6 kg)    VITALS: BP 134/84 (BP Location: Right Arm)   Ht 5' 11 (1.803 m)   Wt 267 lb 12.8 oz (121.5 kg)   BMI 37.35 kg/m   EXAM: Deferred  EKG: Deferred  ASSESSMENT: Mixed dyslipidemia with high triglycerides Type 2 diabetes-A1c 6.3% Moderate obesity Obstructive sleep apnea on CPAP  PLAN: 1.   [name] has a mixed dyslipidemia with high triglycerides.  He may have an underlying genetic triglyceride disorder.  Diabetes is well-controlled and I do not think that is contributing much to his lipids.  He would benefit from weight loss, increased exercise and more regular compliance with CPAP.  His most recent triglycerides were only minimally elevated at 201.  He is interested as to whether or not he may have some underlying coronary artery disease related to these triglyceride elevations and therefore we will check a coronary calcium score.  If this is 0 we can probably consider trying over-the-counter fish oils or a fibrate for triglyceride lowering.  If there is some cardiovascular disease then I would advocate starting statin therapy.  Thanks again for the kind referral.  Samuel KYM Maxcy, MD, [facility], FNLA, FACP  [place]  [facility]  Medical Director of the [facility]  [facility]ovascular Risk [facility] Diplomate of the American Board of Clinical Lipidology Attending Cardiologist  Direct Dial: [phone]  Fax: [phone]  Website:  [email or web address] Samuel Valencia 10/11/2023, 9:24 AM

## 2023-10-19 ENCOUNTER — Ambulatory Visit (HOSPITAL_BASED_OUTPATIENT_CLINIC_OR_DEPARTMENT_OTHER)
Admission: RE | Admit: 2023-10-19 | Discharge: 2023-10-19 | Disposition: A | Discharge status: Home / Self Care | Payer: Self-pay | Source: Ambulatory Visit | Attending: Internal Medicine | Admitting: Internal Medicine

## 2023-10-19 ENCOUNTER — Ambulatory Visit: Payer: Self-pay | Admitting: Internal Medicine

## 2023-10-19 DIAGNOSIS — E781 Pure hyperglyceridemia: Secondary | ICD-10-CM | POA: Insufficient documentation

## 2023-10-19 DIAGNOSIS — I7789 Other specified disorders of arteries and arterioles: Secondary | ICD-10-CM

## 2023-10-27 ENCOUNTER — Other Ambulatory Visit: Payer: Self-pay | Admitting: Family Medicine

## 2023-10-27 DIAGNOSIS — E118 Type 2 diabetes mellitus with unspecified complications: Secondary | ICD-10-CM

## 2023-11-10 DIAGNOSIS — G4733 Obstructive sleep apnea (adult) (pediatric): Secondary | ICD-10-CM | POA: Diagnosis not present

## 2023-12-27 ENCOUNTER — Ambulatory Visit: Payer: BC Managed Care – PPO | Admitting: Adult Health

## 2024-01-09 DIAGNOSIS — G4733 Obstructive sleep apnea (adult) (pediatric): Secondary | ICD-10-CM | POA: Diagnosis not present

## 2024-04-15 ENCOUNTER — Encounter: Payer: Self-pay | Admitting: Emergency Medicine

## 2024-04-15 ENCOUNTER — Ambulatory Visit
Admission: EM | Admit: 2024-04-15 | Discharge: 2024-04-15 | Disposition: A | Discharge status: Home / Self Care | Source: Home / Self Care

## 2024-04-15 ENCOUNTER — Other Ambulatory Visit: Payer: Self-pay

## 2024-04-15 ENCOUNTER — Ambulatory Visit (INDEPENDENT_AMBULATORY_CARE_PROVIDER_SITE_OTHER)

## 2024-04-15 DIAGNOSIS — S060X0A Concussion without loss of consciousness, initial encounter: Secondary | ICD-10-CM

## 2024-04-15 DIAGNOSIS — R059 Cough, unspecified: Secondary | ICD-10-CM

## 2024-04-15 DIAGNOSIS — S20229A Contusion of unspecified back wall of thorax, initial encounter: Secondary | ICD-10-CM

## 2024-04-15 DIAGNOSIS — J209 Acute bronchitis, unspecified: Secondary | ICD-10-CM

## 2024-04-15 MED ORDER — PREDNISONE 50 MG PO TABS: ORAL_TABLET | ORAL | 0 refills | Status: AC

## 2024-04-15 MED ORDER — CYCLOBENZAPRINE HCL 5 MG PO TABS: 5.0000 mg | ORAL_TABLET | Freq: Three times a day (TID) | ORAL | 0 refills | Status: AC | PRN

## 2024-04-15 MED ORDER — GUAIFENESIN ER 600 MG PO TB12: 600.0000 mg | ORAL_TABLET | Freq: Two times a day (BID) | ORAL | 0 refills | Status: AC

## 2024-04-15 MED ORDER — BENZONATATE 100 MG PO CAPS: 100.0000 mg | ORAL_CAPSULE | Freq: Three times a day (TID) | ORAL | 0 refills | Status: AC

## 2024-04-15 NOTE — Discharge Instructions (Addendum)
 Chest xray shows no sign of pneumonia.   Today you have been diagnosed with a musculoskeletal injury. You should use ice on affected area for 20 minutes at a time a couple times a day for the first 24 hours then you may switch to heat in the same intervals.  Be sure to put a barrier between ice or heat source and skin to prevent burns.  May also wrap affected area and Ace bandage if tolerated and appropriate, and elevate above the level of the heart to help reduce swelling.  Do not wrap Ace bandages around neck or torso as wrapping too tight can restrict air movement inability to breathe.  If symptoms do not seem to be improving in 3 to 5 days after following these instructions we need to follow-up with orthopedist or PCP.    You have been diagnosed with a closed head trauma and have been advised to do brain rest for 24 to 48 hours.  This is to rest your brain in order to assist in healing after brain trauma.  Brain rest includes no screen time of any form (he no television, no phone, no iPad, no computer, etc.), no loud music, no bright lights, no vigorous activity, or heavy concentration (that means schoolwork, studying, reading, puzzles, or work).  You may sleep is much as you want but the object is to be bored so that your brain can rest.  Treat your pain with ibuprofen  and Tylenol .  If your symptoms do not seem to be improving you will need to follow-up with PCP or neurology.

## 2024-04-15 NOTE — ED Provider Notes (Signed)
 " [facility]    CSN: [id number] Arrival date & time: 04/15/24  1627      History   Chief Complaint Chief Complaint  Patient presents with   Fall    HPI Samuel Valencia is a 49 y.o. male.   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   Laryngopharyngeal reflux (LPR) 05/21/2017   Papilloma of uvula 05/21/2017   Pharyngoesophageal dysphagia 05/21/2017   Routine general medical examination at a health care facility 05/09/2017   Enlarged tonsils 05/09/2017   Benign essential hypertension 11/06/2013    Past Surgical History:  Procedure Laterality Date   NASOPHARYNGEAL BIOPSY Bilateral 08/09/2022   Procedure: ENDOSCOPIC TRANSNASAL BIOPSY OF ADENOID;  Surgeon: Luciano Standing, MD;  Location: [facility];  Service: ENT;  Laterality: Bilateral;   TONSILLECTOMY Right 08/09/2022   Procedure: EXCISION OF TONSIL PAPILLOMA;  Surgeon: Luciano Standing, MD;  Location: [facility];  Service: ENT;  Laterality: Right;   VASECTOMY         Home Medications    Prior to Admission medications  Medication Sig Start Date End Date Taking? Authorizing Provider  benzonatate  (TESSALON ) 100 MG capsule Take 1 capsule (100 mg total) by mouth every 8 (eight) hours. 04/15/24  Yes Andra Krabbe C, PA-C  cyclobenzaprine  (FLEXERIL ) 5 MG tablet Take 1 tablet (5 mg total) by mouth 3 (three) times daily as needed. 04/15/24  Yes Andra Krabbe BROCKS,  PA-C  guaiFENesin  (MUCINEX ) 600 MG 12 hr tablet Take 1 tablet (600 mg total) by mouth 2 (two) times daily for 10 days. 04/15/24 04/25/24 Yes Andra Krabbe BROCKS, PA-C  predniSONE  (DELTASONE ) 50 MG tablet Take 1 tab po daily for 5 days 04/15/24  Yes Andra Krabbe C, PA-C  amLODipine  (NORVASC ) 5 MG tablet TAKE 1 TABLET(5 MG) BY MOUTH DAILY 09/06/23   Mercer Clotilda SAUNDERS, MD  metFORMIN  (GLUCOPHAGE ) 500 MG tablet TAKE 1 TABLET(500 MG) BY MOUTH DAILY WITH BREAKFAST 10/30/23   Mercer Clotilda SAUNDERS, MD  potassium chloride  SA (KLOR-CON  M) 20 MEQ tablet Take 1 tablet (20 mEq total) by mouth daily for 4 days. 08/17/23 10/10/23  Mercer Clotilda SAUNDERS, MD  Vitamin D , Ergocalciferol , (DRISDOL ) 1.25 MG (50000 UNIT) CAPS capsule Take 1 capsule (50,000 Units total) by mouth every 7 (seven) days. 08/17/23   Mercer Clotilda SAUNDERS, MD    Family History Family History  Problem Relation Age of Onset   Hypertension Mother    Hypertension Father    Hyperlipidemia Father    Diabetes Father    Colon polyps Neg Hx    Colon cancer Neg Hx    Esophageal cancer Neg Hx    Stomach cancer Neg Hx    Rectal cancer Neg Hx    Neuropathy Neg Hx    Headache Neg Hx    Sleep apnea Neg Hx     Social History Social History[1]  Allergies   Patient has no known allergies.   Review of Systems Review of Systems   Physical Exam Triage Vital Signs ED Triage Vitals [04/15/24 1736]  Encounter Vitals Group     BP (!) 170/102     Girls Systolic BP Percentile      Girls Diastolic BP Percentile      Boys Systolic BP Percentile      Boys Diastolic BP Percentile      Pulse Rate 81     Resp 18     Temp 97.9 F (36.6 C)     Temp Source Oral     SpO2 97 %     Weight      Height      Head Circumference      Peak Flow      Pain Score 5     Pain Loc      Pain Education      Exclude from Growth Chart    No data found.  Updated Vital Signs BP (!) 170/102 (BP Location: Right Arm)   Pulse 81   Temp 97.9 F (36.6 C) (Oral)   Resp 18    SpO2 97%   Visual Acuity Right Eye Distance:   Left Eye Distance:   Bilateral Distance:    Right Eye Near:   Left Eye Near:    Bilateral Near:     Physical Exam   UC Treatments / Results  Labs (all labs ordered are listed, but only abnormal results are displayed) Labs Reviewed - No data to display  EKG   Radiology DG Chest 2 View Result Date: 04/15/2024 CLINICAL DATA:  Status post fall. EXAM: CHEST - 2 VIEW COMPARISON:  Jul 24, 2012 FINDINGS: The heart size and mediastinal contours are within normal limits. Both lungs are clear. The visualized skeletal structures are unremarkable. IMPRESSION: No active cardiopulmonary disease. Electronically Signed   By: Suzen Dials M.D.   On: 04/15/2024 18:40    Procedures Procedures (including critical care time)  Medications Ordered in UC Medications - No data to display  Initial Impression / Assessment and Plan / UC Course  I have reviewed the triage vital signs and the nursing notes.  Pertinent labs & imaging results that were available during my care of the patient were reviewed by me and considered in my medical decision making (see chart for details).     *** Final Clinical Impressions(s) / UC Diagnoses   Final diagnoses:  Cough, unspecified type  Acute bronchitis, unspecified organism  Concussion without loss of consciousness, initial encounter  Contusion of back, unspecified laterality, initial encounter     Discharge Instructions      Chest xray shows no sign of pneumonia.   Today you have been diagnosed with a musculoskeletal injury. You should use ice on affected area for 20 minutes at a time a couple times a day for the first 24 hours then you may switch to heat in the same intervals.  Be sure to put a barrier between ice or heat source and skin to prevent burns.  May also wrap affected area and Ace bandage if tolerated and appropriate, and elevate above the level of the heart to help reduce swelling.  Do  not wrap Ace bandages around neck or torso as wrapping too tight can restrict air movement inability to breathe.  If symptoms do not seem to be improving in 3 to 5 days after following these instructions we need to follow-up with orthopedist or PCP.  You have been diagnosed with a closed head trauma and have been advised to do brain rest for 24 to 48 hours.  This is to rest your brain in order to assist in healing after brain trauma.  Brain rest includes no screen time of any form (he no television, no phone, no iPad, no computer, etc.), no loud music, no bright lights, no vigorous activity, or heavy concentration (that means schoolwork, studying, reading, puzzles, or work).  You may sleep is much as you want but the object is to be bored so that your brain can rest.  Treat your pain with ibuprofen  and Tylenol .  If your symptoms do not seem to be improving you will need to follow-up with PCP or neurology.    ED Prescriptions     Medication Sig Dispense Auth. Provider   benzonatate  (TESSALON ) 100 MG capsule Take 1 capsule (100 mg total) by mouth every 8 (eight) hours. 30 capsule Andra Krabbe C, PA-C   guaiFENesin  (MUCINEX ) 600 MG 12 hr tablet Take 1 tablet (600 mg total) by mouth 2 (two) times daily for 10 days. 20 tablet Tico Crotteau C, PA-C   predniSONE  (DELTASONE ) 50 MG tablet Take 1 tab po daily for 5 days 5 tablet Andra Krabbe C, PA-C   cyclobenzaprine  (FLEXERIL ) 5 MG tablet Take 1 tablet (5 mg total) by mouth 3 (three) times daily as needed. 30 tablet Andra Krabbe BROCKS, PA-C      PDMP not reviewed this encounter.    [1]  Social History Tobacco Use   Smoking status: Never   Smokeless tobacco: Never  Vaping Use   Vaping status: Never Used  Substance Use Topics   Alcohol use: No   Drug use: No   "

## 2024-04-17 ENCOUNTER — Ambulatory Visit: Admitting: Family Medicine

## 2024-04-17 ENCOUNTER — Encounter: Payer: Self-pay | Admitting: Family Medicine

## 2024-04-17 VITALS — BP 142/94 | HR 88 | Temp 98.7°F | Ht 71.0 in | Wt 279.0 lb

## 2024-04-17 DIAGNOSIS — W009XXD Unspecified fall due to ice and snow, subsequent encounter: Secondary | ICD-10-CM

## 2024-04-17 DIAGNOSIS — M549 Dorsalgia, unspecified: Secondary | ICD-10-CM

## 2024-04-17 DIAGNOSIS — R635 Abnormal weight gain: Secondary | ICD-10-CM

## 2024-04-17 DIAGNOSIS — E118 Type 2 diabetes mellitus with unspecified complications: Secondary | ICD-10-CM

## 2024-04-17 DIAGNOSIS — J40 Bronchitis, not specified as acute or chronic: Secondary | ICD-10-CM

## 2024-04-17 DIAGNOSIS — S060X0D Concussion without loss of consciousness, subsequent encounter: Secondary | ICD-10-CM

## 2024-04-17 DIAGNOSIS — R202 Paresthesia of skin: Secondary | ICD-10-CM

## 2024-04-17 DIAGNOSIS — I10 Essential (primary) hypertension: Secondary | ICD-10-CM

## 2024-04-17 LAB — POCT GLYCOSYLATED HEMOGLOBIN (HGB A1C): Hemoglobin A1C: 7.1 % — AB (ref 4.0–5.6)

## 2024-04-17 MED ORDER — AMLODIPINE BESYLATE 10 MG PO TABS: 10.0000 mg | ORAL_TABLET | Freq: Every day | ORAL | 3 refills | Status: AC

## 2024-04-17 MED ORDER — DAPAGLIFLOZIN PROPANEDIOL 5 MG PO TABS: 5.0000 mg | ORAL_TABLET | Freq: Every day | ORAL | 3 refills | Status: AC

## 2024-04-17 NOTE — Progress Notes (Signed)
 "  Established Patient Office Visit   Subjective  Patient ID: Samuel Valencia, male    DOB: 04/29/1975  Age: 49 y.o. MRN: [id number]  Chief Complaint  Patient presents with   Acute Visit    Patient came in today for a fall at work, patient was walking 04/13/24 1:30 am, and fell on ice, hit his head, patient is having, mid back pain, neck pain, headaches, rates the pain 7 out of 10,  patient was seen in UC 2/3 (also for a cough) patient was given flexeril  patient is not taking meds at this time    Pt is a 49 year old male seen for acute concern.  Pt endorses slipping and falling on ice at work on 04/13/2024 landing on his back and hitting head.  The heat went out at [facility] and he was checking a propane tank when he fell.  Pt finished his shift, then notified his company.  Went to UC on 2/3 for headaches, grogginess, back pain.  Also noted a productive cough with thick mucus and chills.  Dx'd with a concussion and bronchitis.  Given Flexeril  and cough medication including Tessalon  and Mucinex  which patient has yet to take.  Pt had not heard from his employer regarding Workmen's Comp. being approved until after scheduling this appointment.  Taken out of work until 04/27/2024 by UC.  Endorses headaches, word finding, difficulty focusing, irritability.  Not on a blood thinner.  Pt has not been checking BP at home.  Taking Norvasc  5 mg daily.  Denies headaches, CP, changes in vision, LE edema.  Was working on lifestyle modifications but got off track.  Endorses weight gain.  Notes paresthesias/soreness in L UE and underneath armpit.    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   Past Surgical History:  Procedure Laterality Date   NASOPHARYNGEAL BIOPSY Bilateral 08/09/2022   Procedure: ENDOSCOPIC TRANSNASAL BIOPSY OF ADENOID;  Surgeon: Luciano Standing, MD;  Location: [facility];  Service: ENT;  Laterality: Bilateral;   TONSILLECTOMY Right 08/09/2022   Procedure: EXCISION OF TONSIL PAPILLOMA;  Surgeon: Luciano Standing, MD;  Location: [facility];  Service: ENT;  Laterality: Right;   VASECTOMY     Social History[1] Family History  Problem Relation Age of Onset   Hypertension Mother    Hypertension Father    Hyperlipidemia Father    Diabetes Father    Heart disease Father    Heart failure Father    Colon polyps Neg Hx    Colon cancer Neg Hx    Esophageal cancer Neg Hx    Stomach cancer Neg Hx    Rectal cancer Neg Hx    Neuropathy Neg Hx    Headache Neg Hx  Sleep apnea Neg Hx    Allergies[2]  ROS Negative unless stated above    Objective:     BP (!) 142/94 (BP Location: Left Arm, Patient Position: Sitting, Cuff Size: Large)   Pulse 88   Temp 98.7 F (37.1 C) (Oral)   Ht 5' 11 (1.803 m)   Wt 279 lb (126.6 kg)   SpO2 94%   BMI 38.91 kg/m  BP Readings from Last 3 Encounters:  04/17/24 (!) 142/94  04/15/24 (!) 170/102  10/10/23 134/84   Wt Readings from Last 3 Encounters:  04/17/24 279 lb (126.6 kg)  10/10/23 267 lb 12.8 oz (121.5 kg)  08/16/23 263 lb (119.3 kg)      Physical Exam Constitutional:      General: He is not in acute distress.    Appearance: Normal appearance.  HENT:      Head: Normocephalic and atraumatic.     Nose: Nose normal.     Mouth/Throat:     Mouth: Mucous membranes are moist.  Eyes:     Extraocular Movements: Extraocular movements intact.     Right eye: Normal extraocular motion and no nystagmus.     Left eye: Normal extraocular motion.     Conjunctiva/sclera: Conjunctivae normal.     Pupils: Pupils are equal, round, and reactive to light.  Cardiovascular:     Rate and Rhythm: Normal rate and regular rhythm.     Heart sounds: Normal heart sounds. No murmur heard.    No gallop.  Pulmonary:     Effort: Pulmonary effort is normal. No respiratory distress.     Breath sounds: Normal breath sounds. No wheezing, rhonchi or rales.  Musculoskeletal:       Arms:     Cervical back: Normal range of motion. No rigidity or tenderness.     Comments: TTP of Upper thoracic spine.  No TTP of cervical, lower thoracic, lumbar, or paraspinal muscles.   Skin:    General: Skin is warm and dry.     Comments: Subtle thickening of skin of neck without hyperpigmentation.  Neurological:     Mental Status: He is alert and oriented to person, place, and time.     Comments: Mild word finding.        04/17/2024    9:45 AM 08/16/2023    2:10 PM 04/11/2023    4:33 PM  Depression screen PHQ 2/9  Decreased Interest 0 0 0  Down, Depressed, Hopeless 0 0   PHQ - 2 Score 0 0 0  Altered sleeping 0 0 0  Tired, decreased energy 0 0 0  Change in appetite 0 0 0  Feeling bad or failure about yourself  0 0 0  Trouble concentrating 0 0 0  Moving slowly or fidgety/restless 0 0 0  Suicidal thoughts 0 0 0  PHQ-9 Score 0 0  0   Difficult doing work/chores  Not difficult at all Not difficult at all     Data saved with a previous flowsheet row definition      04/17/2024    9:45 AM 08/16/2023    2:11 PM 04/11/2023    4:33 PM 12/08/2022    4:07 PM  GAD 7 : Generalized Anxiety Score  Nervous, Anxious, on Edge 0 0  0  0   Control/stop worrying 0 0  0  0   Worry too much -  different things 0 0  0  0   Trouble relaxing 0 0  0  0   Restless  0 0  0  0   Easily annoyed or irritable 0 0  0  0   Afraid - awful might happen 0 0  0  0   Total GAD 7 Score 0 0 0 0  Anxiety Difficulty  Not difficult at all Not difficult at all Not difficult at all     Data saved with a previous flowsheet row definition     Results for orders placed or performed in visit on 04/17/24  POC HgB A1c  Result Value Ref Range   Hemoglobin A1C 7.1 (A) 4.0 - 5.6 %   HbA1c POC (<> result, manual entry)     HbA1c, POC (prediabetic range)     HbA1c, POC (controlled diabetic range)        Assessment & Plan:   Essential hypertension -     amLODIPine  Besylate; Take 1 tablet (10 mg total) by mouth daily.  Dispense: 90 tablet; Refill: 3  Concussion without loss of consciousness, subsequent encounter  Fall due to slipping on ice or snow, subsequent encounter  Acute upper back pain  Bronchitis  Paresthesia -     POCT glycosylated hemoglobin (Hb A1C)  Weight gain -     POCT glycosylated hemoglobin (Hb A1C)  Controlled type 2 diabetes mellitus with complication, without long-term current use of insulin (HCC) -     POCT glycosylated hemoglobin (Hb A1C) -     Dapagliflozin  Propanediol; Take 1 tablet (5 mg total) by mouth daily.  Dispense: 90 tablet; Refill: 3   BP uncontrolled.  Recheck.  D/c norvasc  5 mg daily.  Increase dose to 10 mg daily.  Monitor bp at home and bring log to next OFV in 3 weeks.  Discussed the importance of continuing lifestyle modifications. Body mass index is 38.91 kg/m.  Weight gain noted.  Now 279 lbs.  A1c this visit 7.1%.  Paresthesias may be related to insulin resistance causing gynecomastia.  Start Farxiga .  Previously on metformin .  Follow-up with employer regarding Workmen's Comp. claim for fall causing concussion and upper back pain.  Okay to use Flexeril  as needed.  Can also use Tylenol , topical analgesics, heat, stretching, massage.  Avoid NSAIDs given  elevation in BP.  Mental rest advised.  Given strict precautions.  Return in about 3 weeks (around 05/08/2024) for blood pressure, diabetes.   Clotilda JONELLE Single, MD     [1]  Social History Tobacco Use   Smoking status: Never   Smokeless tobacco: Never  Vaping Use   Vaping status: Never Used  Substance Use Topics   Alcohol use: No   Drug use: No  [2] No Known Allergies  "

## 2024-04-17 NOTE — Patient Instructions (Addendum)
 Your hemoglobin A1c is 7.1%.  This is in the diabetic range.  A prescription for Farxiga  5 mg daily was sent to your pharmacy this is a medication to help control your blood sugar.  Continue working on diet and exercise changes to help lower your blood sugar, weight, and blood pressure.  Consistent changes can help you get off of medication.

## 2024-05-08 ENCOUNTER — Ambulatory Visit: Admitting: Family Medicine
